# Patient Record
Sex: Male | Born: 1955 | Race: White | Hispanic: No | Marital: Married | Smoking: Former smoker
Health system: Southern US, Academic
[De-identification: ages and names within clinical notes are randomized; demographics above are authoritative.]

## PROBLEM LIST (undated history)

## (undated) HISTORY — PX: SPINAL FUSION: SHX223

## (undated) HISTORY — PX: HX HERNIA REPAIR: SHX51

---

## 1993-05-14 ENCOUNTER — Other Ambulatory Visit (HOSPITAL_COMMUNITY): Payer: Self-pay

## 2013-04-23 IMAGING — CR DG CHEST 2V
2 series · 2 of 2 positions shown · non-contrast
Comparison: None.

CLINICAL DATA: Preoperative evaluation, hypertension, history of
smoking

EXAM:
CHEST  2 VIEW

[w chest pa]
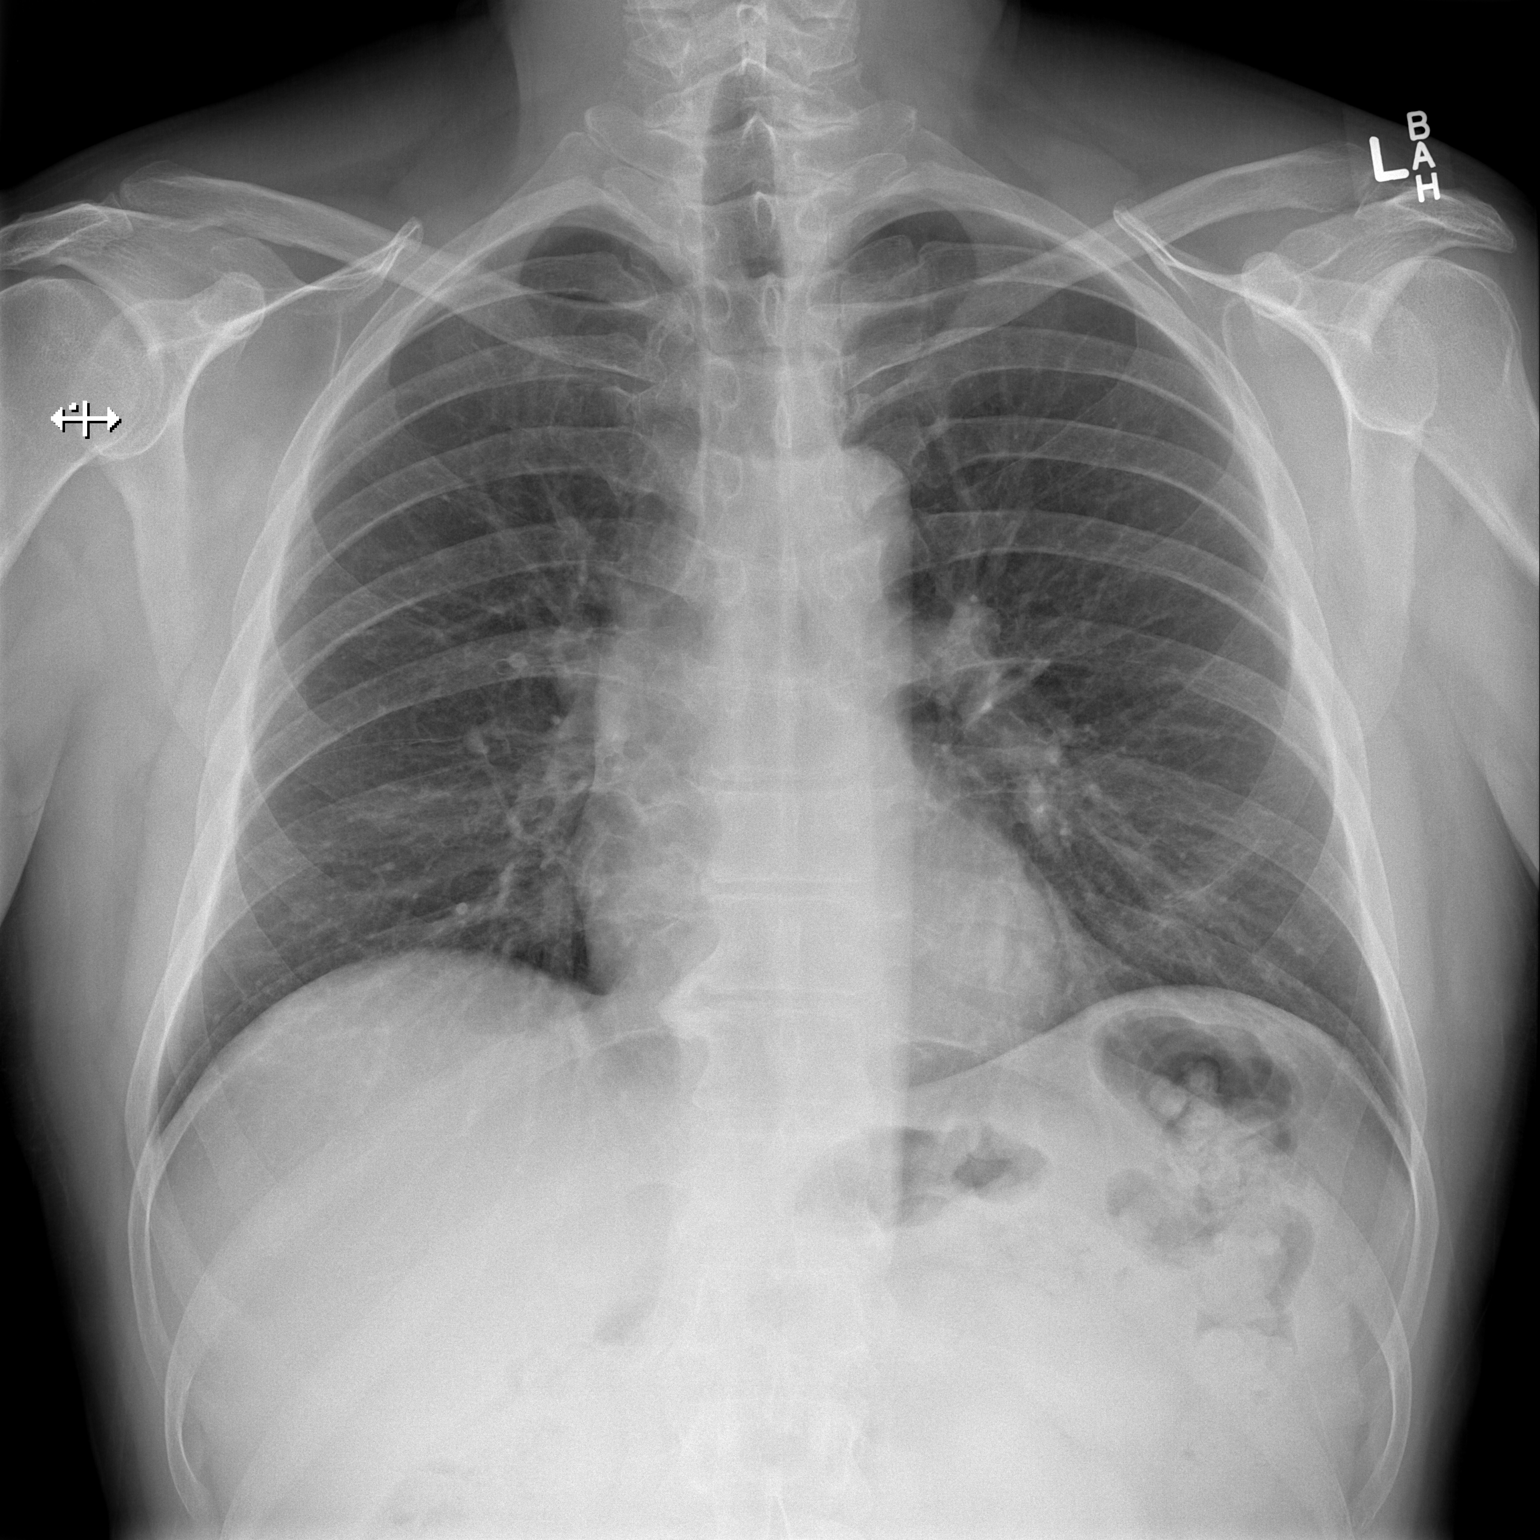

[w chest lat]
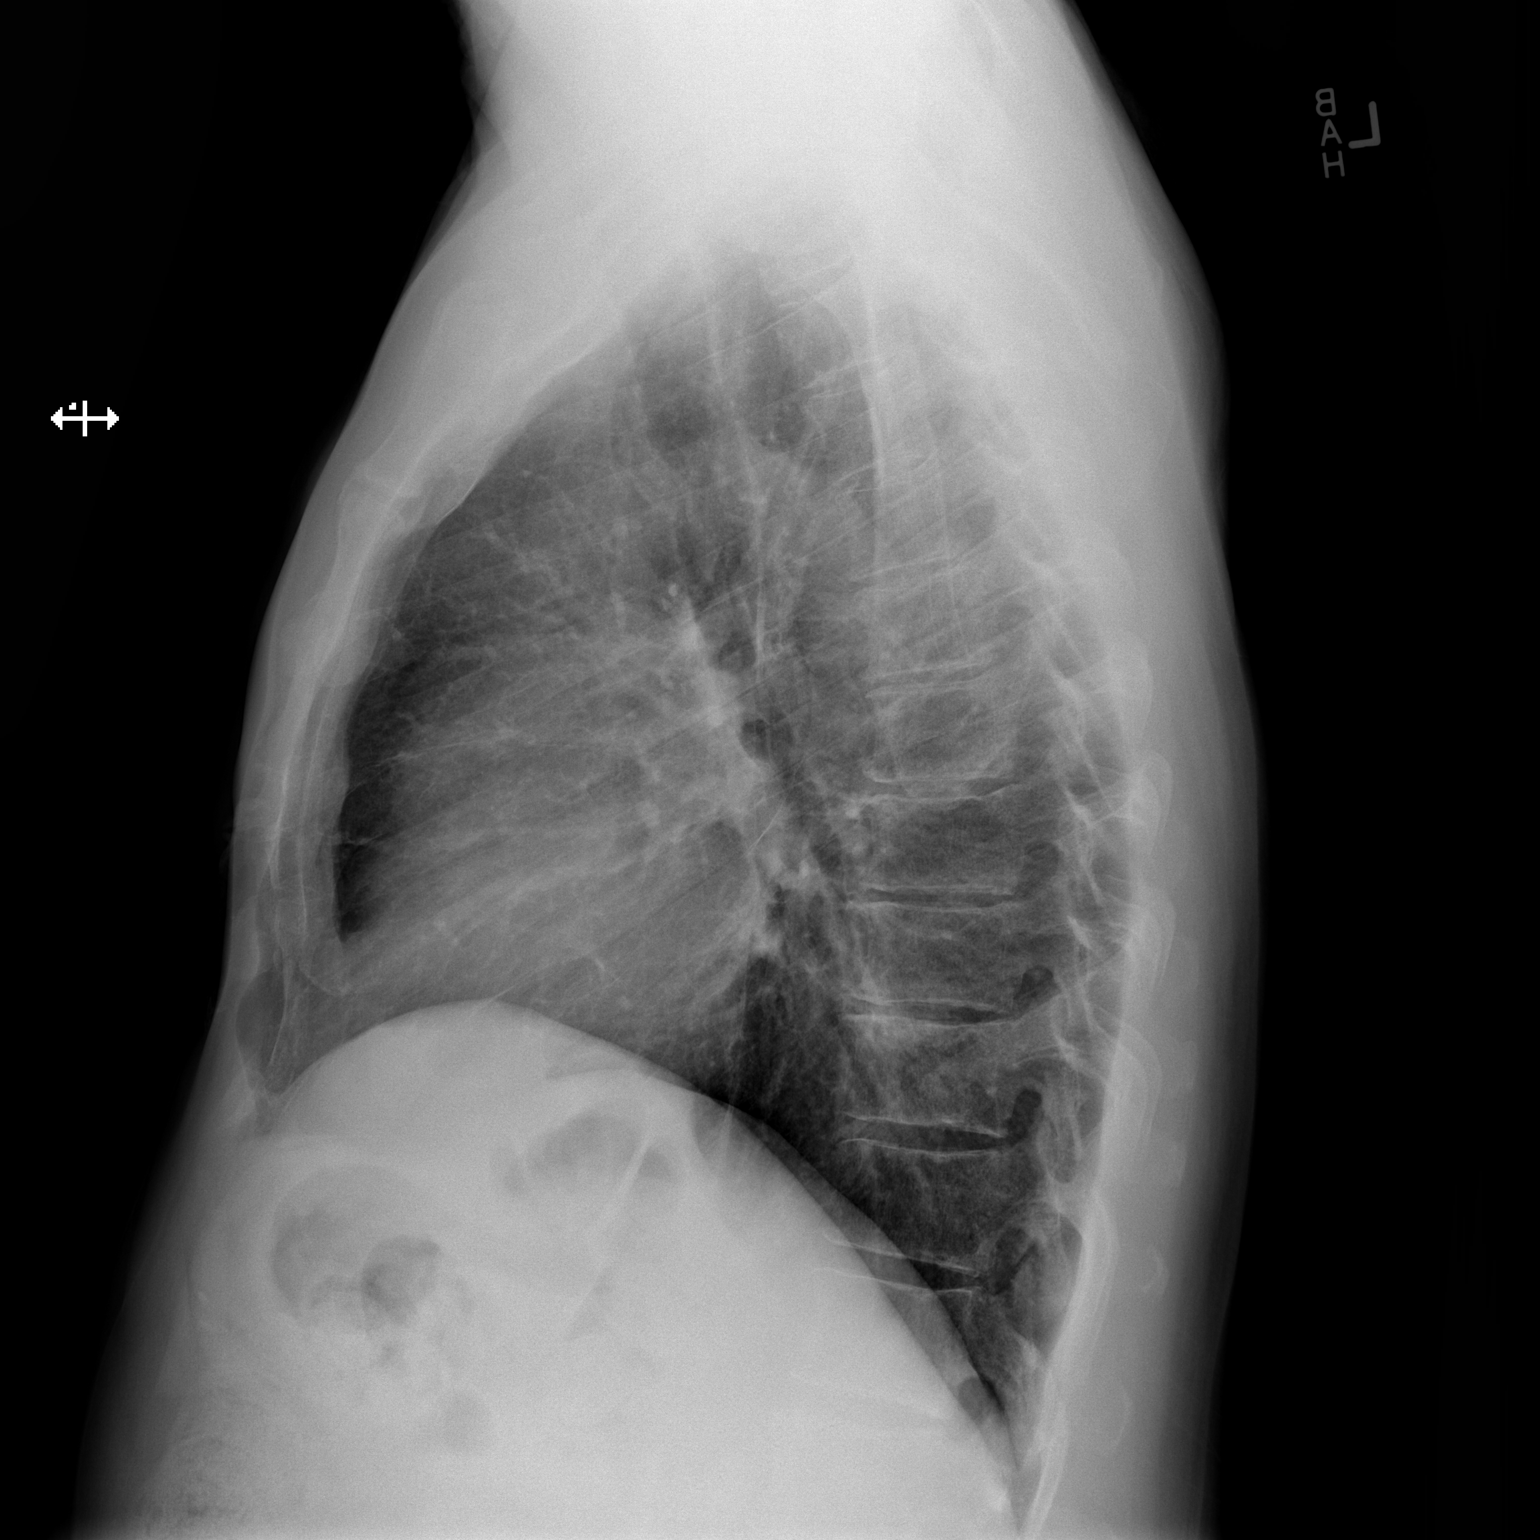

[2 of 2 positions shown; findings below may reference images not displayed]

FINDINGS: The heart size and mediastinal contours are within normal limits.
Both lungs are clear. The visualized skeletal structures are
unremarkable.
IMPRESSION: No active cardiopulmonary disease.

## 2013-05-03 IMAGING — RF DG LUMBAR SPINE 2-3V
1 series · 2 of 2 positions shown · non-contrast
Comparison: Lumbar spine MRI -[DATE]

FLUOROSCOPY TIME:  1 min, 1 second

CLINICAL DATA: PLIF L5-S1

EXAM:
DG C-ARM 1-60 MIN; LUMBAR SPINE - 2-3 VIEW

[Series 1: run · 2 of 2 slices shown]
[im 1/2]
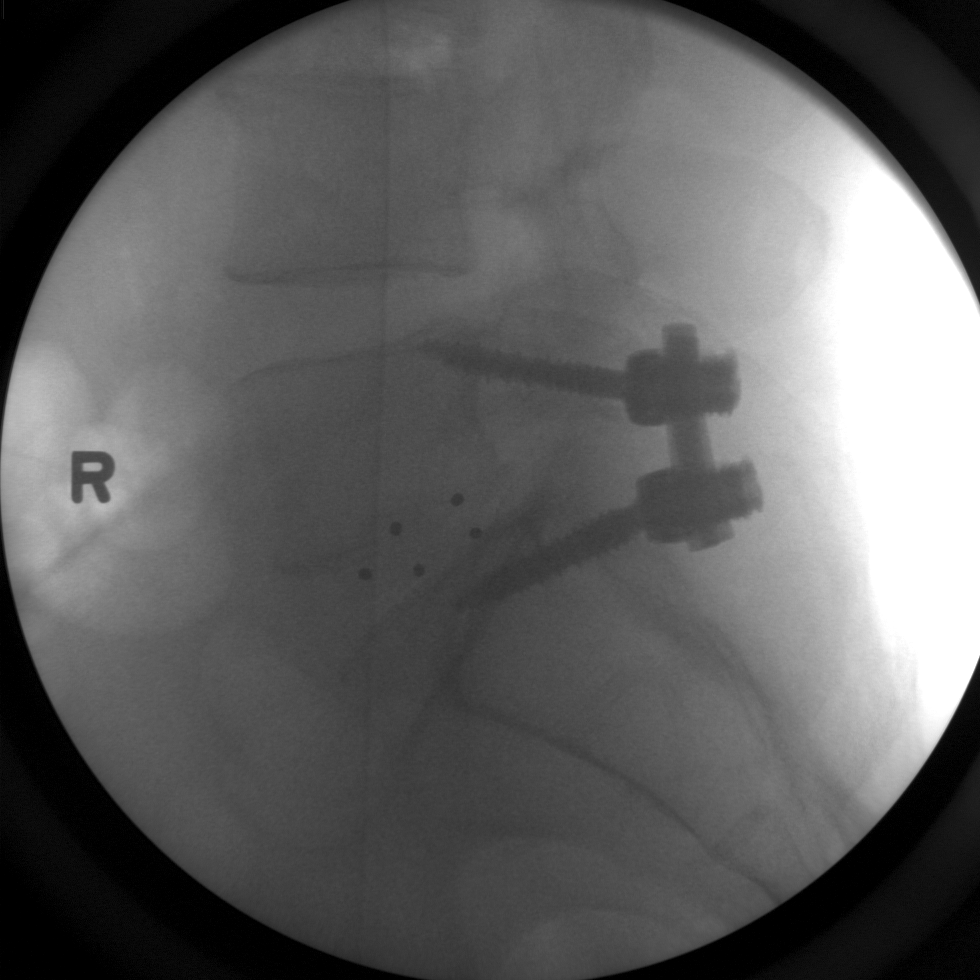
[im 2/2]
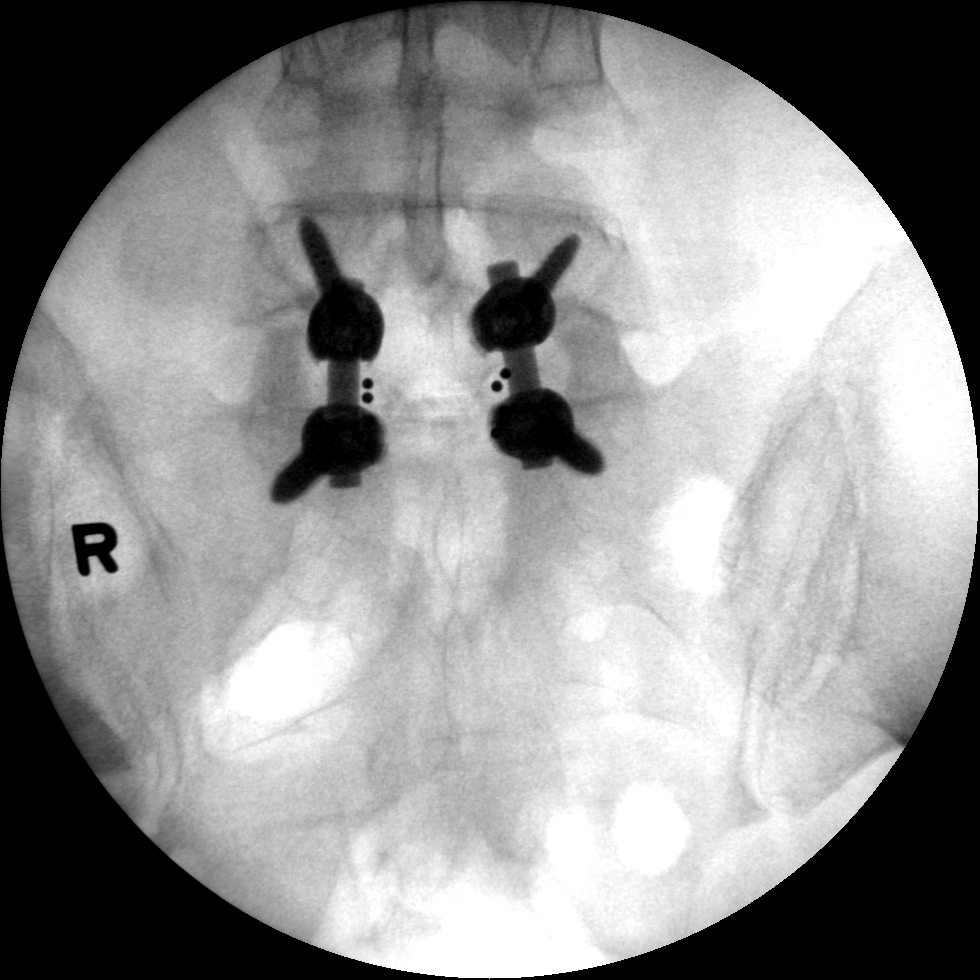

[2 of 2 positions shown; findings below may reference images not displayed]

FINDINGS: Two spot fluoroscopic intraoperative images of the lower lumbar
spine are provided for review.

Lumbar spinal labeling is based on the presumption of 5 non
rib-bearing lumbar type vertebral bodies.

Post L5-S1 of paraspinal fusion and intervertebral disc space
replacement. There has been restoration of the L5-S1 intervertebral
disc space. No definite evidence for hardware failure or loosening.

No definite radiopaque foreign body.
IMPRESSION: Post L5-S1 paraspinal fusion and intervertebral disc space
replacement without definite evidence of complication.

## 2014-04-04 IMAGING — CT CT L SPINE W/O CM
3 of 10 series · 9 of 27 positions shown, 10 images · non-contrast
Comparison: Radiographs [DATE]

CLINICAL DATA: Persistent low back pain for the past few months.
History of L5-S1 fusion and [DATE]. No radicular symptoms.

EXAM:
CT LUMBAR SPINE WITHOUT CONTRAST
TECHNIQUE: Multidetector CT imaging of the lumbar spine was performed without
intravenous contrast administration. Multiplanar CT image
reconstructions were also generated.

[Series 2: l spine bone · axial · 0.32mm/px · z∈[-27,+71]mm · 2 of 118 slices shown, 3 images]
[im 40/118  soft-tissue]
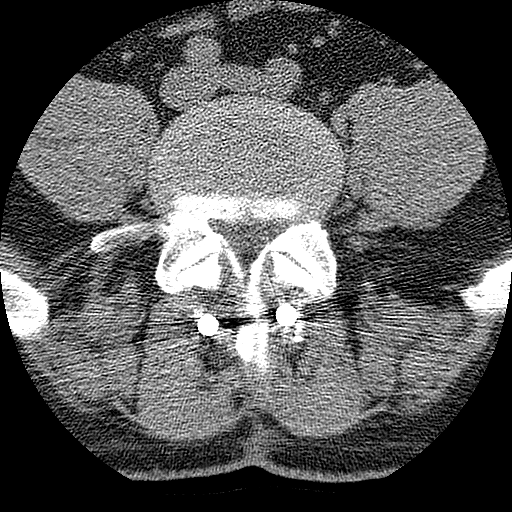
[im 40/118  bone]
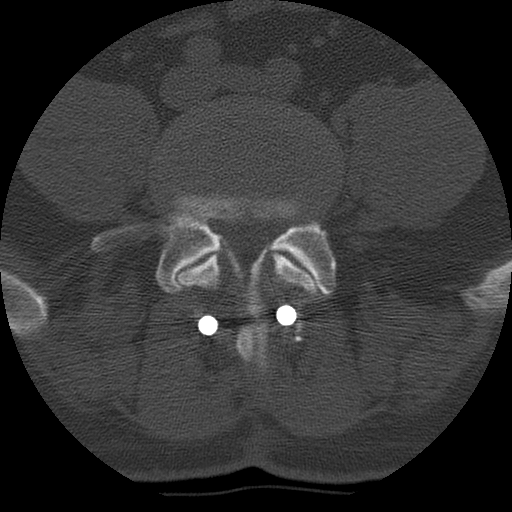
[im 79/118  bone]
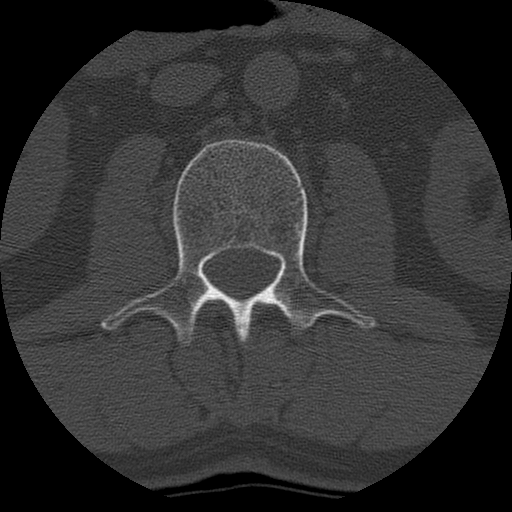

[Series 3: l spine soft · axial · 0.32mm/px · z∈[-27,+71]mm · 2 of 118 slices shown]
[im 40/118  soft-tissue]
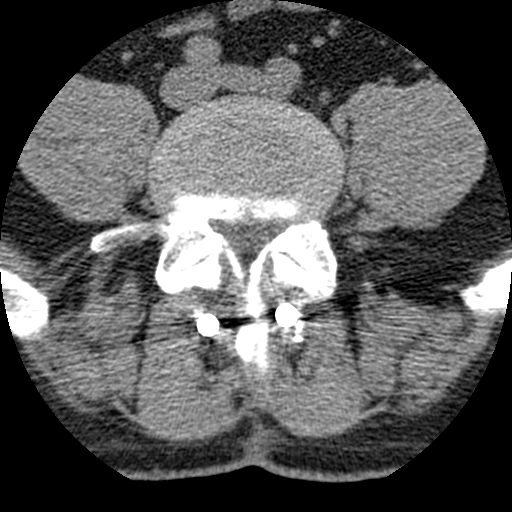
[im 79/118  soft-tissue]
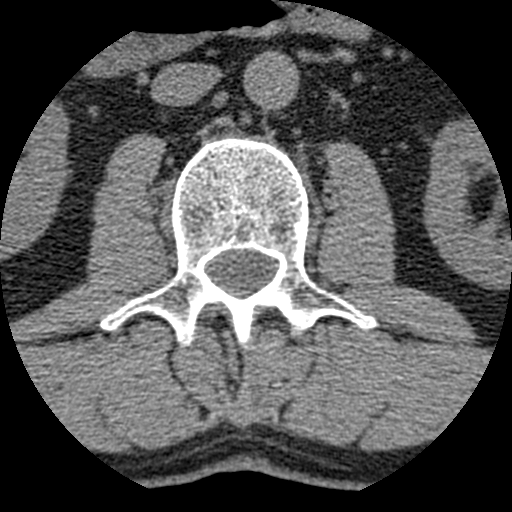

[Series 400: cor · coronal · 0.59mm/px · 5 of 73 slices shown]
[im 13/73  bone]
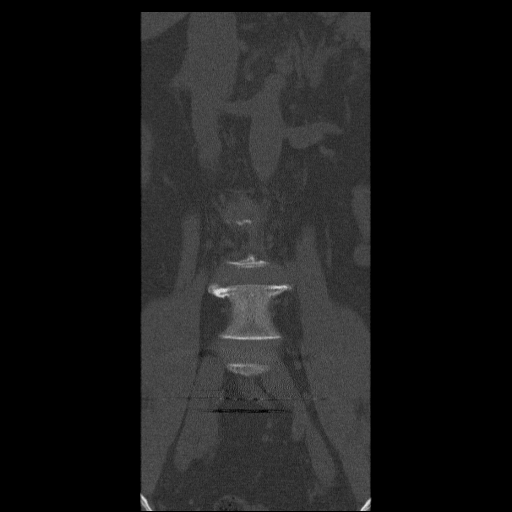
[im 25/73  bone]
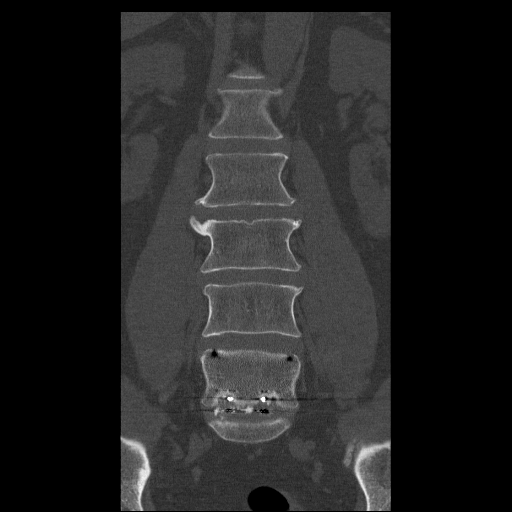
[im 37/73  bone]
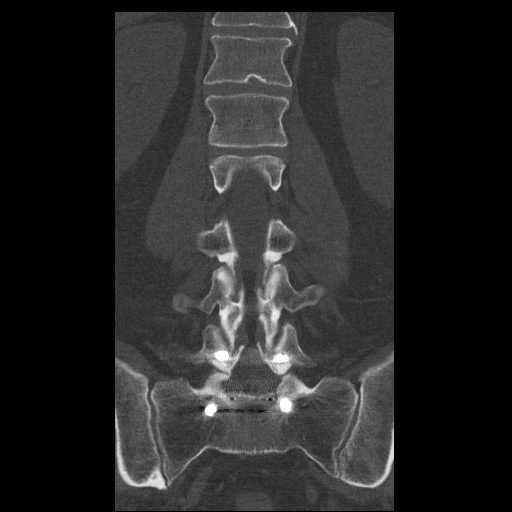
[im 49/73  bone]
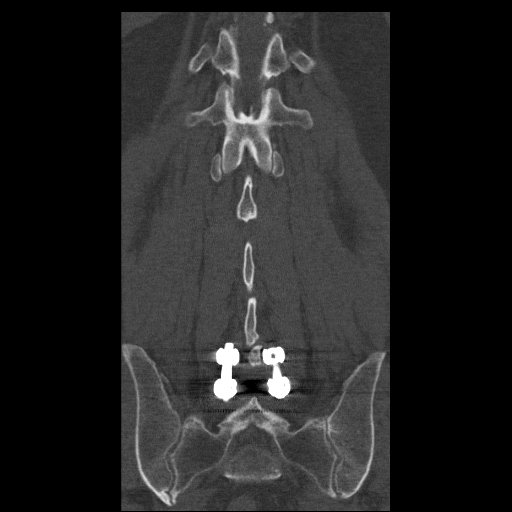
[im 61/73  bone]
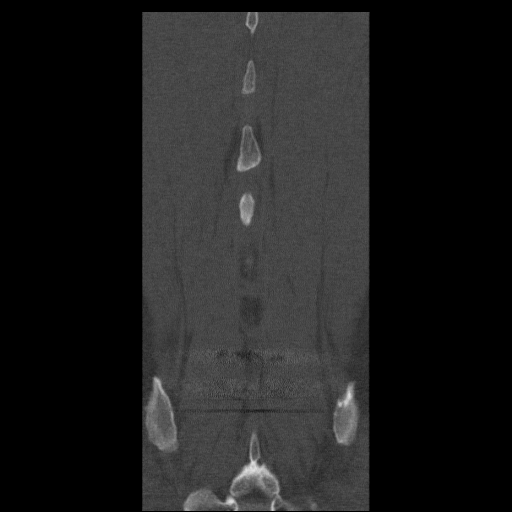

[9 of 27 positions shown; findings below may reference images not displayed]

FINDINGS: Normal alignment of the lumbar vertebral bodies. There are pedicle
screws, posterior rods and an interbody fusion devices at L5-S1. No
complicating features associated with the hardware. No solid
interbody fusion changes are demonstrated. The lumbar vertebral
bodies are maintained. The disc spaces are maintained.

L1-2:  No significant findings.

L2-3: Diffuse annular bulge with mild flattening of the ventral
thecal sac and mild bilateral lateral recess stenosis. There is also
bilateral foraminal encroachment.

L3-4: Mild diffuse annular bulge but no significant spinal stenosis.
Mild lateral recess encroachment bilaterally. No foraminal stenosis.

L4-5: Diffuse bulging annulus, facet disease and ligamentum flavum
thickening contributing to mild to moderate spinal and bilateral
lateral recess stenosis. No significant foraminal stenosis.

L5-S1: Postoperative changes. No complicating features. No solid
interbody fusion changes are demonstrated. The SI joints appear
normal.
IMPRESSION: 1. Diffuse bulging discs at L2-3, L3-4 and L4-5.
2. Postoperative changes at L5-S1. No solid interbody fusion changes
are demonstrated.

## 2022-02-03 ENCOUNTER — Encounter (RURAL_HEALTH_CENTER): Payer: Self-pay | Admitting: Family Medicine

## 2022-02-03 ENCOUNTER — Other Ambulatory Visit: Payer: Self-pay

## 2022-02-03 ENCOUNTER — Ambulatory Visit (RURAL_HEALTH_CENTER): Payer: Medicare Other | Attending: Family Medicine | Admitting: Family Medicine

## 2022-02-03 VITALS — BP 150/91 | HR 92 | Temp 97.9°F | Resp 20 | Ht 70.0 in | Wt 202.0 lb

## 2022-02-03 DIAGNOSIS — Z125 Encounter for screening for malignant neoplasm of prostate: Secondary | ICD-10-CM

## 2022-02-03 DIAGNOSIS — F411 Generalized anxiety disorder: Secondary | ICD-10-CM | POA: Insufficient documentation

## 2022-02-03 DIAGNOSIS — Z794 Long term (current) use of insulin: Secondary | ICD-10-CM | POA: Insufficient documentation

## 2022-02-03 DIAGNOSIS — E119 Type 2 diabetes mellitus without complications: Secondary | ICD-10-CM | POA: Insufficient documentation

## 2022-02-03 DIAGNOSIS — M5126 Other intervertebral disc displacement, lumbar region: Secondary | ICD-10-CM | POA: Insufficient documentation

## 2022-02-03 DIAGNOSIS — I10 Essential (primary) hypertension: Secondary | ICD-10-CM | POA: Insufficient documentation

## 2022-02-03 MED ORDER — TRAMADOL 50 MG TABLET
1.0000 | ORAL_TABLET | Freq: Three times a day (TID) | ORAL | 2 refills | Status: DC | PRN
Start: 2022-02-03 — End: 2022-05-10

## 2022-02-03 MED ORDER — ALPRAZOLAM 0.5 MG TABLET
0.5000 mg | ORAL_TABLET | Freq: Two times a day (BID) | ORAL | 2 refills | Status: DC | PRN
Start: 2022-02-03 — End: 2022-05-10

## 2022-02-03 MED ORDER — DIPHTH,PERTUS(AC)TETANUS(PF)2 LF-(2.5-5-3-5MCG)-5 LF/0.5 ML IM SYRINGE
0.5000 mL | INJECTION | Freq: Once | INTRAMUSCULAR | 0 refills | Status: AC
Start: 2022-02-03 — End: 2022-02-03

## 2022-02-03 NOTE — Nursing Note (Signed)
02/03/22 1437   Depression Screen   Little interest or pleasure in doing things. 0   Feeling down, depressed, or hopeless 0   PHQ 2 Total 0   Trouble falling or staying asleep, or sleeping too much. 0   Feeling tired or having little energy 0   Poor appetite or overeating 0   Feeling bad about yourself/ that you are a failure in the past 2 weeks? 0   Trouble concentrating on things in the past 2 weeks? 0   Moving/Speaking slowly or being fidgety or restless  in the past 2 weeks? 0   Thoughts that you would be better off DEAD, or of hurting yourself in some way. 0   If you checked off any problems, how difficult have these problems made it for you to do your work, take care of things at home, or get along with other people? Not difficult at all   PHQ 9 Total 0

## 2022-02-03 NOTE — Progress Notes (Signed)
FAMILY MEDICINE, [facility]  [address] [place]  [place] [address]  Operated by [facility]     Name: Shane Hines MRN:  [id number]   Date: 02/03/2022 Age: 66 y.o.          Provider: Arnell Sieving, DO    Reason for visit: New Patient (Establish care)      History of Present Illness:  Shane Hines is a 66 y.o. male here today to establish care. Previously following with Dr. Tresea Mall    Diabetes:   ~2012  States they have been high lately because of frequent eating out in [place]. Last was 212. Runs 170's at home. Denies hyper or hypoglycemia.   Metformin and Glimepiride    Hypertension:   No home monitoring reported. No other BP medications ever tried  Lisinopril 10 mg    Back Surgery 2014  Spinal fusion that didn't heal completely with 3 currently herniated disks  Continues to follow with Dr. Yetta Barre in [place], [place]  Tramadol helps some. Flexeril at least every am - sometimes up to 3 times/day. He denies drowsiness or fatigue with this medication. Has been out of Tramadol x 3 months.     Anxiety:   Stable. Reports that he feels the medication works well.   Lexapro 20 mg  Xanax 0.5 twice daily prn      Historical Data    Past Medical History:  Past Medical History:   Diagnosis Date    Anxiety     Diabetes mellitus, type 2 (CMS HCC)     Hypertension     Spinal stenosis     Spinal stenosis        Past Surgical History:  Past Surgical History:   Procedure Laterality Date    HX HERNIA REPAIR Bilateral     SPINAL FUSION      Dr. Marikay [facility]          Allergies:  Allergies   Allergen Reactions    Penicillins      Unknown - reaction as a child     Medications:  Current Outpatient Medications   Medication Sig    ALPRAZolam (XANAX) 0.5 mg Oral Tablet Take 1 Tablet (0.5 mg total) by mouth Twice per day as needed    cyclobenzaprine (FLEXERIL) 10 mg Oral Tablet Take 1 Tablet (10 mg total) by mouth Three times a day    escitalopram oxalate (LEXAPRO) 20 mg  Oral Tablet Take 1 Tablet (20 mg total) by mouth Once a day    glimepiride (AMARYL) 2 mg Oral Tablet Take 1 Tablet (2 mg total) by mouth Every morning    lisinopriL (PRINIVIL) 10 mg Oral Tablet Take 1 Tablet (10 mg total) by mouth Once a day    metFORMIN (GLUCOPHAGE XR) 500 mg Oral Tablet Sustained Release 24 hr Take 2 Tablets (1,000 mg total) by mouth Twice daily    traMADoL (ULTRAM) 50 mg Oral Tablet Take 1 Tablet (50 mg total) by mouth Three times a day as needed     Family History:  Family Medical History:    None         Social History:  Social History     Socioeconomic History    Marital status: Married   Tobacco Use    Smoking status: Never    Smokeless tobacco: Never   Substance and Sexual Activity    Alcohol use: Yes     Alcohol/week: 5.0 standard drinks  Types: 2 Glasses of wine, 3 Cans of beer per week     Comment: occassionally    Drug use: Never           Review of Systems:  Review of Systems   Constitutional:  Negative for fatigue, fever and unexpected weight change.   Respiratory:  Negative for chest tightness and shortness of breath.    Cardiovascular:  Negative for chest pain and leg swelling.   Gastrointestinal:  Negative for constipation, diarrhea, nausea and vomiting.   Endocrine: Negative for polydipsia, polyphagia and polyuria.   Musculoskeletal:  Positive for arthralgias and back pain.   Neurological:  Negative for dizziness, light-headedness and headaches.   Psychiatric/Behavioral:  Negative for dysphoric mood. The patient is nervous/anxious.        Physical Exam:  Vital Signs:  Vitals:    02/03/22 1430   BP: (!) 150/91   Pulse: 92   Resp: 20   Temp: 36.6 C (97.9 F)   SpO2: 95%   Weight: 91.6 kg (202 lb)   Height: 1.778 m (5\' 10" )   BMI: 29.04     Physical Exam  Vitals reviewed.   Constitutional:       General: He is not in acute distress.     Appearance: Normal appearance.   HENT:      Mouth/Throat:      Mouth: Mucous membranes are moist.   Eyes:      Pupils: Pupils are equal, round,  and reactive to light.   Neck:      Vascular: No carotid bruit.   Cardiovascular:      Rate and Rhythm: Normal rate and regular rhythm.      Pulses: Normal pulses.      Heart sounds: Normal heart sounds. No murmur heard.  Pulmonary:      Effort: Pulmonary effort is normal.      Breath sounds: Normal breath sounds.   Abdominal:      General: Bowel sounds are normal.      Palpations: Abdomen is soft.      Tenderness: There is no abdominal tenderness. There is no guarding.   Musculoskeletal:         General: No swelling or deformity.      Cervical back: Neck supple. No tenderness.      Right lower leg: No edema.      Left lower leg: No edema.   Lymphadenopathy:      Cervical: No cervical adenopathy.   Skin:     General: Skin is warm and dry.   Neurological:      General: No focal deficit present.      Mental Status: He is alert.   Psychiatric:         Mood and Affect: Mood normal.         Behavior: Behavior normal.         Thought Content: Thought content normal.           Assessment:  1. Type 2 diabetes mellitus without complication, with long-term current use of insulin (CMS HCC)  Continue Metformin and Glimepiride as previously prescribed. No refills due at this time. Labs ordered for monitoring. Monitor blood sugar at home and bring in logs to follow up appointment. Follow up sooner if your blood sugar is consistently >250 or <80 or you develop symptoms of hyper or hypoglycemia. Continue to focus on a diabetic diet of low carbohydrates and low sugar.   - Monitor your feet regularly for  neuropathy and also checking for blisters, cuts, sores or calluses that may need addressed.   - Follow up with your eye doctor at least once a year for your annual eye exam.   - Labs ordered for review.      - CBC/DIFF; Future  - COMPREHENSIVE METABOLIC PNL, FASTING; Future  - HGA1C (HEMOGLOBIN A1C WITH EST AVG GLUCOSE); Future  - LIPID PANEL; Future  - THYROID STIMULATING HORMONE (SENSITIVE TSH); Future  - MICROALBUMIN/CREATININE  RATIO, URINE, RANDOM; Future    2. Hypertension, unspecified type  Continue Lisinopril as previously prescribed. BP elevated in office today - continue to monitor. Recommend home blood pressure monitoring and bring in logs to follow up appointment. I also recommend that you bring in your blood pressure machine to compare to ours approximately once/year for accuracy. Follow up if your blood pressure is persistently elevated or too low or you are experiencing symptoms of either.     Focus on following the DASH diet (low sodium) to help control blood pressure.     The DASH diet is a balanced eating plan that gives choices of what to eat. The diet helps create a heart-healthy eating style for life. There's no need for special foods or drinks. Foods in the diet are at grocery stores and in most restaurants.     When following DASH, it is important to choose foods that are:  - Rich in potassium, calcium, magnesium, fiber and protein.  - Low in saturated fat.  - Low in salt.   - CBC/DIFF; Future  - COMPREHENSIVE METABOLIC PNL, FASTING; Future  - LIPID PANEL; Future  - THYROID STIMULATING HORMONE (SENSITIVE TSH); Future    3. Lumbar disc herniation  Continue to follow with Dr. Ronnald Ramp. I am agreeable to writing Tramadol and Flexeril. Rx sent to pharmacy.     4. Prostate cancer screening    - PSA SCREENING; Future     5. Generalized Anxiety Disorder:   Lexapro and Xanax refilled. Stable on current medications.     7. Need for TDaP:   Rx sent to pharmacy due to [facility]    Return in about 3 months (around 05/05/2022) for In Person Visit - CDM.    Lowella Grip, DO     Portions of this note may be dictated using voice recognition software or a dictation service. Variances in spelling and vocabulary are possible and unintentional. Not all errors are caught/corrected. Please notify the [facility] if any discrepancies are noted or if the meaning of any statement is not clear.

## 2022-02-09 LAB — LIPID PANEL
CHOLESTEROL: 180
HDL-CHOLESTEROL: 36
LDL (CALCULATED): 107
TRIGLYCERIDES: 214
VLDL (CALCULATED): 37

## 2022-02-09 LAB — CBC/DIFF
BASOPHILS: 1
BASOS ABS: 0.1
EOS ABS: 0.4
EOSINOPHIL: 6
HCT: 43
HGB: 14.7
LYMPHOCYTES: 35
LYMPHS ABS: 2.2
MCH: 31.1
MCHC: 34.2
MCV: 91
MONOCYTES: 10
MONOS ABS: 0.7
PLATELET COUNT: 236
RBC: 4.73
RDW: 12.3
WBC: 6.5

## 2022-02-09 LAB — COMPREHENSIVE METABOLIC PNL, FASTING
ALBUMIN: 4.6
ALKALINE PHOSPHATASE: 56
ALT (SGPT): 38
AST (SGOT): 30
BILIRUBIN, TOTAL: 0.5
BUN/CREAT RATIO: 18
BUN: 19
CALCIUM: 9.9
CARBON DIOXIDE: 23
CHLORIDE: 101
CREATININE: 1.05
ESTIMATED GLOMERULAR FILTRATION RATE: 79
GLUCOSE, FASTING: 241
POTASSIUM: 4.8
SODIUM: 138
TOTAL PROTEIN: 7

## 2022-02-09 LAB — THYROID STIMULATING HORMONE (SENSITIVE TSH): TSH: 0.696

## 2022-02-09 LAB — HGA1C (HEMOGLOBIN A1C WITH EST AVG GLUCOSE): HEMOGLOBIN A1C: 9.3

## 2022-02-09 LAB — MICROALBUMIN/CREATININE RATIO, URINE, RANDOM
CREATININE, UR RAND: 151.8
MICROALBUMIN RANDOM URINE: 3
MICROALBUMIN/CREAT RATIO: 3.9

## 2022-02-09 LAB — PSA SCREENING: PROSTATE SPECIFIC AG: 0.8

## 2022-02-12 ENCOUNTER — Other Ambulatory Visit: Payer: Self-pay

## 2022-02-21 ENCOUNTER — Encounter (RURAL_HEALTH_CENTER): Payer: Self-pay | Admitting: Family Medicine

## 2022-02-28 ENCOUNTER — Other Ambulatory Visit (RURAL_HEALTH_CENTER): Payer: Self-pay | Admitting: Family Medicine

## 2022-02-28 ENCOUNTER — Encounter (RURAL_HEALTH_CENTER): Payer: Self-pay | Admitting: Family Medicine

## 2022-02-28 DIAGNOSIS — M5126 Other intervertebral disc displacement, lumbar region: Secondary | ICD-10-CM

## 2022-02-28 MED ORDER — CYCLOBENZAPRINE 10 MG TABLET
10.0000 mg | ORAL_TABLET | Freq: Three times a day (TID) | ORAL | 1 refills | Status: DC
Start: 2022-02-28 — End: 2022-08-10

## 2022-03-18 ENCOUNTER — Telehealth (RURAL_HEALTH_CENTER): Payer: Self-pay | Admitting: Family Medicine

## 2022-03-18 NOTE — Telephone Encounter (Signed)
Received patient's [facility] labs (copies) and reviewed: Please apologize for the delay.    Blood count looks excellent Sodium, potassium, and kidney function are all excellent. Liver function is normal.     PSA is normal. Thyroid is normal.     Total cholesterol is normal, Triglycerides are elevated at 214 and LDL (bad cholesterol) is elevated.     A1C is significantly elevated at 9.3 - this could be secondary to the frequent eating out prior to appointment; but I would recommend but increasing Amaryl to 2mg  po twice daily with close blood sugar monitoring at home. If patient is agreeable - I can send in new Rx since he will run out sooner. If he does not want to increase then I would recommend close diet monitoring and we can repeat at his appointment in December and make decisions from there.

## 2022-03-21 ENCOUNTER — Other Ambulatory Visit (RURAL_HEALTH_CENTER): Payer: Self-pay

## 2022-03-21 ENCOUNTER — Other Ambulatory Visit (RURAL_HEALTH_CENTER): Payer: Self-pay | Admitting: Family Medicine

## 2022-03-21 DIAGNOSIS — Z794 Long term (current) use of insulin: Secondary | ICD-10-CM

## 2022-03-21 DIAGNOSIS — Z125 Encounter for screening for malignant neoplasm of prostate: Secondary | ICD-10-CM

## 2022-03-21 DIAGNOSIS — I10 Essential (primary) hypertension: Secondary | ICD-10-CM

## 2022-03-21 MED ORDER — GLIMEPIRIDE 2 MG TABLET
2.0000 mg | ORAL_TABLET | Freq: Two times a day (BID) | ORAL | 1 refills | Status: DC
Start: 2022-03-21 — End: 2022-08-10

## 2022-03-21 NOTE — Telephone Encounter (Signed)
Ok to increase the Amaryl - Send to [facility]

## 2022-03-21 NOTE — Telephone Encounter (Signed)
Rx sent to [facility]

## 2022-05-09 ENCOUNTER — Encounter (RURAL_HEALTH_CENTER): Payer: Self-pay | Admitting: Family Medicine

## 2022-05-09 DIAGNOSIS — E119 Type 2 diabetes mellitus without complications: Secondary | ICD-10-CM

## 2022-05-09 DIAGNOSIS — I10 Essential (primary) hypertension: Secondary | ICD-10-CM

## 2022-05-09 DIAGNOSIS — F419 Anxiety disorder, unspecified: Secondary | ICD-10-CM

## 2022-05-09 DIAGNOSIS — M4800 Spinal stenosis, site unspecified: Secondary | ICD-10-CM

## 2022-05-09 HISTORY — DX: Anxiety disorder, unspecified: F41.9

## 2022-05-09 HISTORY — DX: Essential (primary) hypertension: I10

## 2022-05-09 HISTORY — DX: Spinal stenosis, site unspecified: M48.00

## 2022-05-09 HISTORY — DX: Type 2 diabetes mellitus without complications (CMS HCC): E11.9

## 2022-05-09 HISTORY — DX: Type 2 diabetes mellitus without complications: E11.9

## 2022-05-10 ENCOUNTER — Ambulatory Visit (RURAL_HEALTH_CENTER): Payer: Medicare Other | Attending: Family Medicine | Admitting: Family Medicine

## 2022-05-10 ENCOUNTER — Encounter (RURAL_HEALTH_CENTER): Payer: Self-pay | Admitting: Family Medicine

## 2022-05-10 ENCOUNTER — Telehealth (RURAL_HEALTH_CENTER): Payer: Self-pay | Admitting: Family Medicine

## 2022-05-10 ENCOUNTER — Other Ambulatory Visit: Payer: Self-pay

## 2022-05-10 VITALS — BP 123/79 | HR 95 | Temp 98.1°F | Resp 20 | Wt 210.0 lb

## 2022-05-10 DIAGNOSIS — E119 Type 2 diabetes mellitus without complications: Secondary | ICD-10-CM | POA: Insufficient documentation

## 2022-05-10 DIAGNOSIS — F411 Generalized anxiety disorder: Secondary | ICD-10-CM | POA: Insufficient documentation

## 2022-05-10 DIAGNOSIS — I10 Essential (primary) hypertension: Secondary | ICD-10-CM | POA: Insufficient documentation

## 2022-05-10 DIAGNOSIS — M5126 Other intervertebral disc displacement, lumbar region: Secondary | ICD-10-CM | POA: Insufficient documentation

## 2022-05-10 DIAGNOSIS — Z87891 Personal history of nicotine dependence: Secondary | ICD-10-CM | POA: Insufficient documentation

## 2022-05-10 DIAGNOSIS — Z794 Long term (current) use of insulin: Secondary | ICD-10-CM | POA: Insufficient documentation

## 2022-05-10 LAB — COMPREHENSIVE METABOLIC PANEL, NON-FASTING
ALBUMIN: 4.8
ALKALINE PHOSPHATASE: 61
ALT (SGPT): 50
AST (SGOT): 33
BILIRUBIN, TOTAL: 0.5
BUN/CREAT RATIO: 13
BUN: 12
CALCIUM: 10.2
CARBON DIOXIDE: 25
CHLORIDE: 98
CREATININE: 0.95
ESTIMATED GLOMERULAR FILTRATION RATE: 89
GLUCOSE,NONFAST: 178
POTASSIUM: 5.2
SODIUM: 138
TOTAL PROTEIN: 7.4

## 2022-05-10 LAB — HGA1C (HEMOGLOBIN A1C WITH EST AVG GLUCOSE): HEMOGLOBIN A1C: 7.7

## 2022-05-10 MED ORDER — RSVPREF3 ANTIGEN-AS01E ADJUVANT(PF) 120 MCG/0.5 ML IM SUSPENSION, KIT
0.5000 mL | INHALATION_SUSPENSION | Freq: Once | INTRAMUSCULAR | 0 refills | Status: AC
Start: 2022-05-10 — End: 2022-05-10

## 2022-05-10 MED ORDER — ALPRAZOLAM 0.5 MG TABLET
0.5000 mg | ORAL_TABLET | Freq: Two times a day (BID) | ORAL | 2 refills | Status: DC | PRN
Start: 2022-05-10 — End: 2022-08-10

## 2022-05-10 MED ORDER — ESCITALOPRAM 20 MG TABLET
20.0000 mg | ORAL_TABLET | Freq: Every day | ORAL | 1 refills | Status: DC
Start: 2022-05-10 — End: 2022-08-10

## 2022-05-10 MED ORDER — TRAMADOL 50 MG TABLET
1.0000 | ORAL_TABLET | Freq: Three times a day (TID) | ORAL | 2 refills | Status: DC | PRN
Start: 2022-05-10 — End: 2022-08-10

## 2022-05-10 MED ORDER — LISINOPRIL 10 MG TABLET
10.0000 mg | ORAL_TABLET | Freq: Every day | ORAL | 1 refills | Status: DC
Start: 2022-05-10 — End: 2022-08-10

## 2022-05-10 NOTE — Progress Notes (Signed)
FAMILY MEDICINE, [facility]  [address] [place]  [place] [address]  Operated by [facility]     Name: Shane Hines MRN:  [id number]   Date: 05/10/2022 Age: 66 y.o.          Provider: Arnell Sieving, DO    Reason for visit: Follow Up 3 Months (refills)      History of Present Illness:  Shane Hines is a 66 y.o. male here today for 3 month follow up and refills.    Diabetes:   ~2012  He states he has been checking it once to twice/day. His sugars have started to improve since increasing the glimepiride dose after last labs. He denies hypoglycemia episodes. He does admit that his diet has once again been off due to the upcoming holidays and spending time with his family in [place].   Metformin and Glimepiride (increased to BID)    Hypertension:   No home monitoring reported.   Lisinopril 10 mg which he is tolerating well.     Back Surgery 2014  Spinal fusion that didn't heal completely with 3 currently herniated disks  Continues to follow with Dr. Yetta Barre in [place], [place]  Tramadol helps some. Flexeril at least every am - sometimes up to 3 times/day. He denies drowsiness or fatigue with either of these medications.    Anxiety:   Stable overall. He feels that the medications are working well. He denies drowsiness or side effects.  Lexapro 20 mg  Xanax 0.5 twice daily prn      Historical Data    Past Medical History:  Past Medical History:   Diagnosis Date    Anxiety 05/09/2022    Diabetes mellitus, type 2 (CMS HCC) 05/09/2022    Hypertension 05/09/2022    Spinal stenosis 05/09/2022    Spinal stenosis 05/09/2022       Past Surgical History:  Past Surgical History:   Procedure Laterality Date    HX HERNIA REPAIR Bilateral     SPINAL FUSION      Dr. Marikay Alar [facility]          Allergies:  Allergies   Allergen Reactions    Penicillins      Unknown - reaction as a child     Medications:  Current Outpatient Medications   Medication Sig    ALPRAZolam  (XANAX) 0.5 mg Oral Tablet Take 1 Tablet (0.5 mg total) by mouth Twice per day as needed    cyclobenzaprine (FLEXERIL) 10 mg Oral Tablet Take 1 Tablet (10 mg total) by mouth Three times a day    escitalopram oxalate (LEXAPRO) 20 mg Oral Tablet Take 1 Tablet (20 mg total) by mouth Once a day    glimepiride (AMARYL) 2 mg Oral Tablet Take 1 Tablet (2 mg total) by mouth Twice a day before meals    lisinopriL (PRINIVIL) 10 mg Oral Tablet Take 1 Tablet (10 mg total) by mouth Once a day    metFORMIN (GLUCOPHAGE XR) 500 mg Oral Tablet Sustained Release 24 hr Take 2 Tablets (1,000 mg total) by mouth Twice daily    traMADoL (ULTRAM) 50 mg Oral Tablet Take 1 Tablet (50 mg total) by mouth Three times a day as needed     Family History:  Family Medical History:       Problem Relation (Age of Onset)    Alcohol abuse Brother    COPD Father    Depression Brother    Heart Disease  Father    Lung Cancer Mother, Sister    PTSD Brother            Social History:  Social History     Socioeconomic History    Marital status: Married   Occupational History    Occupation: DISABLED   Tobacco Use    Smoking status: Former     Types: Cigarettes    Smokeless tobacco: Never   Vaping Use    Vaping Use: Never used   Substance and Sexual Activity    Alcohol use: Yes     Alcohol/week: 5.0 standard drinks of alcohol     Types: 2 Glasses of wine, 3 Cans of beer per week     Comment: occassionally    Drug use: Never           Review of Systems:  Review of Systems   Constitutional:  Negative for fatigue, fever and unexpected weight change.   Respiratory:  Negative for chest tightness and shortness of breath.    Cardiovascular:  Negative for chest pain and leg swelling.   Gastrointestinal:  Negative for constipation, diarrhea, nausea and vomiting.   Endocrine: Negative for polydipsia, polyphagia and polyuria.   Musculoskeletal:  Positive for arthralgias and back pain.   Neurological:  Negative for dizziness, light-headedness and headaches.    Psychiatric/Behavioral:  Negative for dysphoric mood. The patient is nervous/anxious.          Physical Exam:  Vital Signs:  Vitals:    05/10/22 0812   BP: 123/79   Pulse: 95   Resp: 20   Temp: 36.7 C (98.1 F)   SpO2: 96%   Weight: 95.3 kg (210 lb)     Physical Exam  Vitals reviewed.   Constitutional:       General: He is not in acute distress.     Appearance: Normal appearance.   HENT:      Mouth/Throat:      Mouth: Mucous membranes are moist.   Eyes:      Pupils: Pupils are equal, round, and reactive to light.   Neck:      Vascular: No carotid bruit.   Cardiovascular:      Rate and Rhythm: Normal rate and regular rhythm.      Pulses: Normal pulses.      Heart sounds: Normal heart sounds. No murmur heard.  Pulmonary:      Effort: Pulmonary effort is normal.      Breath sounds: Normal breath sounds.   Abdominal:      General: Bowel sounds are normal.      Palpations: Abdomen is soft.      Tenderness: There is no abdominal tenderness. There is no guarding.   Musculoskeletal:         General: No swelling or deformity.      Cervical back: Neck supple. No tenderness.      Right lower leg: No edema.      Left lower leg: No edema.   Lymphadenopathy:      Cervical: No cervical adenopathy.   Skin:     General: Skin is warm and dry.   Neurological:      General: No focal deficit present.      Mental Status: He is alert.   Psychiatric:         Mood and Affect: Mood normal.         Behavior: Behavior normal.         Thought Content: Thought content normal.  A1C: 7.7  A1C Date: 05/10/2022  COMPLETE BLOOD COUNT   Lab Results   Component Value Date    WBC 6.5 02/09/2022    HGB 14.7 02/09/2022    HCT 43.0 02/09/2022    PLTCNT 236 02/09/2022       DIFFERENTIAL  Lab Results   Component Value Date    LYMPHOCYTES 35 02/09/2022    MONOCYTES 10 02/09/2022    EOSINOPHIL 6 02/09/2022    BASOPHILS 1 02/09/2022    LYMPHSABS 2.2 02/09/2022    EOSABS 0.4 02/09/2022    MONOSABS 0.7 02/09/2022    BASOSABS 0.1 02/09/2022      COMPREHENSIVE METABOLIC PANEL FASTING  Lab Results   Component Value Date    SODIUM 138 05/10/2022    POTASSIUM 5.2 05/10/2022    CHLORIDE 98 05/10/2022    CO2 25 05/10/2022    BUN 12 05/10/2022    CREATININE 0.95 05/10/2022    GLUCOSEFAST 241 02/09/2022    CALCIUM 10.2 05/10/2022    ALBUMIN 4.8 05/10/2022    TOTALPROTEIN 7.4 05/10/2022    ALKPHOS 61 05/10/2022    AST 33 05/10/2022    ALT 50 05/10/2022     LIPID PROFILE  Lab Results   Component Value Date    CHOLESTEROL 180 02/09/2022    HDLCHOL 36 02/09/2022    LDLCHOL 107 02/09/2022    TRIG 214 02/09/2022       THYROID STIMULATING HORMONE  Lab Results   Component Value Date    TSH 0.696 02/09/2022           Assessment:  1. GAD (generalized anxiety disorder)  Stable on current Lexapro and Xanax twice daily which was started by his previous physician years ago. I have agreed to continue it at this time. Recommend follow up if you feel that this medication is no longer controlling your symptoms or if you develop homicidal or suicidal ideations. These medications should not be stopped suddenly as there can be withdrawal side effects. If you feel like you need a medication change, follow up and we can titrate the medication together with close monitoring.     - escitalopram oxalate (LEXAPRO) 20 mg Oral Tablet; Take 1 Tablet (20 mg total) by mouth Once a day  Dispense: 90 Tablet; Refill: 1  - ALPRAZolam (XANAX) 0.5 mg Oral Tablet; Take 1 Tablet (0.5 mg total) by mouth Twice per day as needed  Dispense: 60 Tablet; Refill: 2    2. Lumbar disc herniation  Stable on Tramadol and Flexeril which he uses PRN.     - traMADoL (ULTRAM) 50 mg Oral Tablet; Take 1 Tablet (50 mg total) by mouth Three times a day as needed  Dispense: 90 Tablet; Refill: 2    3. Hypertension, unspecified type  Continue Lisinopril as previously prescribed. Recommend home blood pressure monitoring and bring in logs to follow up appointment. I also recommend that you bring in your blood pressure  machine to compare to ours approximately once/year for accuracy. Follow up if your blood pressure is persistently elevated or too low or you are experiencing symptoms of either.   Focus on following the DASH diet (low sodium) to help control blood pressure.     The DASH diet is a balanced eating plan that gives choices of what to eat. The diet helps create a heart-healthy eating style for life. There's no need for special foods or drinks. Foods in the diet are at grocery stores and in most restaurants.  When following DASH, it is important to choose foods that are:  - Rich in potassium, calcium, magnesium, fiber and protein.  - Low in saturated fat.  - Low in salt.    - lisinopriL (PRINIVIL) 10 mg Oral Tablet; Take 1 Tablet (10 mg total) by mouth Once a day  Dispense: 90 Tablet; Refill: 1  - COMPREHENSIVE METABOLIC PANEL, NON-FASTING; Future    4. Type 2 diabetes mellitus without complication, with long-term current use of insulin (CMS HCC)  We will repeat A1C at appropriate time - may still be elevated since glimepiride dose was just increased. We will continue to follow/monitor.     - HGA1C (HEMOGLOBIN A1C WITH EST AVG GLUCOSE); Future    Return in about 3 months (around 08/09/2022) for In Person Visit - CDM.    Arnell Sieving, DO     Portions of this note may be dictated using voice recognition software or a dictation service. Variances in spelling and vocabulary are possible and unintentional. Not all errors are caught/corrected. Please notify the [facility] if any discrepancies are noted or if the meaning of any statement is not clear.

## 2022-05-10 NOTE — Telephone Encounter (Signed)
Patient request RSV Vaccine

## 2022-05-10 NOTE — Nursing Note (Signed)
05/10/22 0817   PHQ 9 (follow up)   Little interest or pleasure in doing things. 0   Feeling down, depressed, or hopeless 0   Trouble falling or staying asleep, or sleeping too much. 0   Feeling tired or having little energy 0   Poor appetite or overeating 0   Feeling bad about yourself/ that you are a failure in the past 2 weeks? 0   Trouble concentrating on things in the past 2 weeks? 0   Moving/Speaking slowly or being fidgety or restless  in the past 2 weeks? 0   Thoughts that you would be better off DEAD, or of hurting yourself in some way. 0   If you checked off any problems, how difficult have these problems made it for you to do your work, take care of things at home, or get along with other people? Not difficult at all   PHQ 9 Total 0

## 2022-05-11 ENCOUNTER — Other Ambulatory Visit (RURAL_HEALTH_CENTER): Payer: Self-pay

## 2022-05-11 DIAGNOSIS — Z794 Long term (current) use of insulin: Secondary | ICD-10-CM

## 2022-05-11 DIAGNOSIS — I10 Essential (primary) hypertension: Secondary | ICD-10-CM

## 2022-05-14 ENCOUNTER — Other Ambulatory Visit: Payer: Self-pay

## 2022-06-29 ENCOUNTER — Encounter (RURAL_HEALTH_CENTER): Payer: Self-pay | Admitting: Family Medicine

## 2022-08-10 ENCOUNTER — Other Ambulatory Visit: Payer: Self-pay

## 2022-08-10 ENCOUNTER — Ambulatory Visit (RURAL_HEALTH_CENTER): Payer: Medicare Other | Attending: Family Medicine | Admitting: Family Medicine

## 2022-08-10 ENCOUNTER — Encounter (RURAL_HEALTH_CENTER): Payer: Self-pay | Admitting: Family Medicine

## 2022-08-10 DIAGNOSIS — I10 Essential (primary) hypertension: Secondary | ICD-10-CM | POA: Insufficient documentation

## 2022-08-10 DIAGNOSIS — Z87891 Personal history of nicotine dependence: Secondary | ICD-10-CM | POA: Insufficient documentation

## 2022-08-10 DIAGNOSIS — M5126 Other intervertebral disc displacement, lumbar region: Secondary | ICD-10-CM | POA: Insufficient documentation

## 2022-08-10 DIAGNOSIS — Z794 Long term (current) use of insulin: Secondary | ICD-10-CM | POA: Insufficient documentation

## 2022-08-10 DIAGNOSIS — F411 Generalized anxiety disorder: Secondary | ICD-10-CM | POA: Insufficient documentation

## 2022-08-10 DIAGNOSIS — E119 Type 2 diabetes mellitus without complications: Secondary | ICD-10-CM | POA: Insufficient documentation

## 2022-08-10 MED ORDER — TRAMADOL 50 MG TABLET
1.0000 | ORAL_TABLET | Freq: Three times a day (TID) | ORAL | 2 refills | Status: DC | PRN
Start: 2022-08-10 — End: 2022-11-01

## 2022-08-10 MED ORDER — ALPRAZOLAM 0.5 MG TABLET
0.5000 mg | ORAL_TABLET | Freq: Two times a day (BID) | ORAL | 2 refills | Status: DC | PRN
Start: 2022-08-10 — End: 2022-11-01

## 2022-08-10 MED ORDER — METFORMIN ER 500 MG TABLET,EXTENDED RELEASE 24 HR
1000.0000 mg | ORAL_TABLET | Freq: Two times a day (BID) | ORAL | 1 refills | Status: DC
Start: 2022-08-10 — End: 2022-12-26

## 2022-08-10 MED ORDER — LISINOPRIL 10 MG TABLET
10.0000 mg | ORAL_TABLET | Freq: Every day | ORAL | 1 refills | Status: DC
Start: 2022-08-10 — End: 2022-12-26

## 2022-08-10 MED ORDER — GLIMEPIRIDE 2 MG TABLET
2.0000 mg | ORAL_TABLET | Freq: Two times a day (BID) | ORAL | 1 refills | Status: DC
Start: 2022-08-10 — End: 2022-12-26

## 2022-08-10 MED ORDER — ESCITALOPRAM 20 MG TABLET
20.0000 mg | ORAL_TABLET | Freq: Every day | ORAL | 1 refills | Status: DC
Start: 2022-08-10 — End: 2022-12-26

## 2022-08-10 MED ORDER — CYCLOBENZAPRINE 10 MG TABLET
10.0000 mg | ORAL_TABLET | Freq: Three times a day (TID) | ORAL | 1 refills | Status: DC
Start: 2022-08-10 — End: 2022-12-26

## 2022-08-10 NOTE — Progress Notes (Signed)
FAMILY MEDICINE, [facility]  [address] [place]  [place] [address]  Operated by [facility]     Name: Shane Hines MRN:  [id number]   Date: 08/10/2022 Age: 67 y.o.          Provider: Arnell Sieving, DO    Reason for visit: Follow Up 3 Months      History of Present Illness:  Shane Hines is a 67 y.o. male here today for 3 month follow up and refills.    Diabetes:   ~2012  He states he has been checking it once to twice/day. His sugars have started to improve since increasing the glimepiride dose after last labs. He denies hypoglycemia episodes. He does admit that his diet has once again been off due to the upcoming holidays and spending time with his family in [place].   Metformin and Glimepiride (increased to BID)    Hypertension:   No home monitoring reported.   Lisinopril 10 mg which he is tolerating well.     Back Surgery 2014  Spinal fusion that didn't heal completely with 3 currently herniated disks  Continues to follow with Dr. Yetta Barre in [place], [place]  Tramadol helps some. Flexeril at least every am - sometimes up to 3 times/day. He denies drowsiness or fatigue with either of these medications.    Anxiety:   Stable overall. He feels that the medications are working well. He denies drowsiness or side effects.  Lexapro 20 mg  Xanax 0.5 twice daily prn      Historical Data    Past Medical History:  Past Medical History:   Diagnosis Date    Anxiety 05/09/2022    Diabetes mellitus, type 2 (CMS HCC) 05/09/2022    Hypertension 05/09/2022    Spinal stenosis 05/09/2022    Spinal stenosis 05/09/2022       Past Surgical History:  Past Surgical History:   Procedure Laterality Date    HX HERNIA REPAIR Bilateral     SPINAL FUSION      Dr. Marikay Al[facility]          Allergies:  Allergies   Allergen Reactions    Penicillins      Unknown - reaction as a child     Medications:  Current Outpatient Medications   Medication Sig    ALPRAZolam (XANAX) 0.5 mg  Oral Tablet Take 1 Tablet (0.5 mg total) by mouth Twice per day as needed    cyclobenzaprine (FLEXERIL) 10 mg Oral Tablet Take 1 Tablet (10 mg total) by mouth Three times a day    escitalopram oxalate (LEXAPRO) 20 mg Oral Tablet Take 1 Tablet (20 mg total) by mouth Once a day    glimepiride (AMARYL) 2 mg Oral Tablet Take 1 Tablet (2 mg total) by mouth Twice a day before meals    lisinopriL (PRINIVIL) 10 mg Oral Tablet Take 1 Tablet (10 mg total) by mouth Once a day    metFORMIN (GLUCOPHAGE XR) 500 mg Oral Tablet Sustained Release 24 hr Take 2 Tablets (1,000 mg total) by mouth Twice daily    traMADoL (ULTRAM) 50 mg Oral Tablet Take 1 Tablet (50 mg total) by mouth Three times a day as needed     Family History:  Family Medical History:       Problem Relation (Age of Onset)    Alcohol abuse Brother    COPD Father    Depression Brother    Heart Disease Father  Lung Cancer Mother, Sister    PTSD Brother            Social History:  Social History     Socioeconomic History    Marital status: Married   Occupational History    Occupation: DISABLED   Tobacco Use    Smoking status: Former     Types: Cigarettes    Smokeless tobacco: Never   Vaping Use    Vaping status: Never Used   Substance and Sexual Activity    Alcohol use: Yes     Alcohol/week: 5.0 standard drinks of alcohol     Types: 2 Glasses of wine, 3 Cans of beer per week     Comment: occassionally    Drug use: Never           Review of Systems:  Review of Systems   Constitutional:  Negative for fatigue, fever and unexpected weight change.   Respiratory:  Negative for chest tightness and shortness of breath.    Cardiovascular:  Negative for chest pain and leg swelling.   Gastrointestinal:  Negative for constipation, diarrhea, nausea and vomiting.   Endocrine: Negative for polydipsia, polyphagia and polyuria.   Musculoskeletal:  Positive for arthralgias and back pain.   Neurological:  Negative for dizziness, light-headedness and headaches.   Psychiatric/Behavioral:   Negative for dysphoric mood. The patient is not nervous/anxious.          Physical Exam:  Vital Signs:  There were no vitals filed for this visit.    Physical Exam  Vitals reviewed.   Constitutional:       General: He is not in acute distress.     Appearance: Normal appearance.   HENT:      Mouth/Throat:      Mouth: Mucous membranes are moist.   Eyes:      Pupils: Pupils are equal, round, and reactive to light.   Neck:      Vascular: No carotid bruit.   Cardiovascular:      Rate and Rhythm: Normal rate and regular rhythm.      Pulses: Normal pulses.      Heart sounds: Normal heart sounds. No murmur heard.  Pulmonary:      Effort: Pulmonary effort is normal.      Breath sounds: Normal breath sounds.   Abdominal:      General: Bowel sounds are normal.      Palpations: Abdomen is soft.      Tenderness: There is no abdominal tenderness. There is no guarding.   Musculoskeletal:         General: No swelling or deformity.      Cervical back: Neck supple. No tenderness.      Right lower leg: No edema.      Left lower leg: No edema.   Lymphadenopathy:      Cervical: No cervical adenopathy.   Skin:     General: Skin is warm and dry.   Neurological:      General: No focal deficit present.      Mental Status: He is alert.   Psychiatric:         Mood and Affect: Mood normal.         Behavior: Behavior normal.         Thought Content: Thought content normal.     A1C: 7.7  A1C Date: 05/10/2022  COMPLETE BLOOD COUNT   Lab Results   Component Value Date    WBC 6.5 02/09/2022  HGB 14.7 02/09/2022    HCT 43.0 02/09/2022    PLTCNT 236 02/09/2022       DIFFERENTIAL  Lab Results   Component Value Date    LYMPHOCYTES 35 02/09/2022    MONOCYTES 10 02/09/2022    EOSINOPHIL 6 02/09/2022    BASOPHILS 1 02/09/2022    LYMPHSABS 2.2 02/09/2022    EOSABS 0.4 02/09/2022    MONOSABS 0.7 02/09/2022    BASOSABS 0.1 02/09/2022     COMPREHENSIVE METABOLIC PANEL FASTING  Lab Results   Component Value Date    SODIUM 138 05/10/2022    POTASSIUM 5.2  05/10/2022    CHLORIDE 98 05/10/2022    CO2 25 05/10/2022    BUN 12 05/10/2022    CREATININE 0.95 05/10/2022    GLUCOSEFAST 241 02/09/2022    CALCIUM 10.2 05/10/2022    ALBUMIN 4.8 05/10/2022    TOTALPROTEIN 7.4 05/10/2022    ALKPHOS 61 05/10/2022    AST 33 05/10/2022    ALT 50 05/10/2022     LIPID PROFILE  Lab Results   Component Value Date    CHOLESTEROL 180 02/09/2022    HDLCHOL 36 02/09/2022    LDLCHOL 107 02/09/2022    TRIG 214 02/09/2022       THYROID STIMULATING HORMONE  Lab Results   Component Value Date    TSH 0.696 02/09/2022           Assessment:    ICD-10-CM    1. GAD (generalized anxiety disorder)  F41.1 Medications refilled. BOP reviewed and accurate.     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 Symptoms stable on current medications.    cyclobenzaprine (FLEXERIL) 10 mg Oral Tablet     traMADoL (ULTRAM) 50 mg Oral Tablet      3. Hypertension, unspecified type  I10 Chronic, stable. Continue current Lisinopril dose. Labs ordered.    lisinopriL (PRINIVIL) 10 mg Oral Tablet     CBC/DIFF     COMPREHENSIVE METABOLIC PANEL, NON-FASTING      4. Type 2 diabetes mellitus without complication, with long-term current use of insulin (CMS HCC)  E11.9 Chronic. Previously uncontrolled but home readings are starting to improve. No hypoglycemia episodes reported. Continue current medications and monitoring of diet.     glimepiride (AMARYL) 2 mg Oral Tablet    Z79.4 metFORMIN (GLUCOPHAGE XR) 500 mg Oral Tablet Sustained Release 24 hr     LIPID PANEL     CBC/DIFF     COMPREHENSIVE METABOLIC PANEL, NON-FASTING     HGA1C (HEMOGLOBIN A1C WITH EST AVG GLUCOSE)          Return in about 4 months (around 12/10/2022) for In Person Visit - CDM.    Arnell Sieving, DO     Portions of this note may be dictated using voice recognition software or a dictation service. Variances in spelling and vocabulary  are possible and unintentional. Not all errors are caught/corrected. Please notify the Thereasa Parkin if any discrepancies are noted or if the meaning of any statement is not clear.

## 2022-08-31 LAB — CBC/DIFF
BASOPHILS: 1
BASOS ABS: 0.1 (ref 0.0–0.2)
EOS ABS: 0.3 (ref 0.0–0.4)
EOSINOPHIL: 4
HCT: 44.3 (ref 37.5–51.0)
HGB: 15 (ref 13.0–17.7)
LYMPHOCYTES: 45
LYMPHS ABS: 3 (ref 0.7–3.1)
MCH: 30.3 (ref 26.6–33.0)
MCHC: 33.9 (ref 31.5–35.7)
MCV: 90 (ref 79–97)
MONOCYTES: 8
MONOS ABS: 0.6 (ref 0.1–0.9)
PLATELET COUNT: 277 (ref 150–450)
RBC: 4.95 (ref 4.14–5.80)
RDW: 12.2 (ref 11.6–15.4)
WBC: 6.8 (ref 3.4–10.8)

## 2022-08-31 LAB — COMPREHENSIVE METABOLIC PANEL, NON-FASTING
ALBUMIN: 4.9 (ref 3.9–4.9)
ALKALINE PHOSPHATASE: 58 (ref 44–121)
ALT (SGPT): 37 (ref 0–44)
AST (SGOT): 30 (ref 0–40)
BILIRUBIN, TOTAL: 0.8 (ref 0.0–1.2)
BUN/CREAT RATIO: 13 (ref 10–24)
BUN: 13 (ref 8–27)
CALCIUM: 11 (ref 8.6–10.2)
CARBON DIOXIDE: 23 (ref 20–29)
CHLORIDE: 99 (ref 96–106)
CREATININE: 1.04 (ref 0.76–1.27)
ESTIMATED GLOMERULAR FILTRATION RATE: 80 (ref >59)
GLUCOSE,NONFAST: 115 (ref 70–99)
POTASSIUM: 4.9 (ref 3.5–5.2)
SODIUM: 139 (ref 134–144)
TOTAL PROTEIN: 7.4 (ref 6.0–8.5)

## 2022-08-31 LAB — HGA1C (HEMOGLOBIN A1C WITH EST AVG GLUCOSE): HEMOGLOBIN A1C: 6.7 (ref 4.8–5.6)

## 2022-08-31 LAB — LIPID PANEL
CHOLESTEROL: 178 (ref 100–199)
HDL-CHOLESTEROL: 36 (ref >39)
LDL (CALCULATED): 110 (ref 0–99)
TRIGLYCERIDES: 182 (ref 0–149)
VLDL (CALCULATED): 32 (ref 5–40)

## 2022-09-01 ENCOUNTER — Encounter (RURAL_HEALTH_CENTER): Payer: Self-pay | Admitting: Family Medicine

## 2022-09-01 ENCOUNTER — Other Ambulatory Visit (RURAL_HEALTH_CENTER): Payer: Self-pay

## 2022-09-01 DIAGNOSIS — I10 Essential (primary) hypertension: Secondary | ICD-10-CM

## 2022-09-01 DIAGNOSIS — Z794 Long term (current) use of insulin: Secondary | ICD-10-CM

## 2022-09-05 ENCOUNTER — Other Ambulatory Visit (RURAL_HEALTH_CENTER): Payer: Self-pay | Admitting: Family Medicine

## 2022-09-05 MED ORDER — SIMVASTATIN 20 MG TABLET
20.0000 mg | ORAL_TABLET | Freq: Every evening | ORAL | 1 refills | Status: DC
Start: 2022-09-05 — End: 2023-03-23

## 2022-11-01 ENCOUNTER — Other Ambulatory Visit (RURAL_HEALTH_CENTER): Payer: Self-pay | Admitting: Family Medicine

## 2022-11-01 DIAGNOSIS — M5126 Other intervertebral disc displacement, lumbar region: Secondary | ICD-10-CM

## 2022-11-01 DIAGNOSIS — F411 Generalized anxiety disorder: Secondary | ICD-10-CM

## 2022-11-02 MED ORDER — ALPRAZOLAM 0.5 MG TABLET
0.5000 mg | ORAL_TABLET | Freq: Two times a day (BID) | ORAL | 2 refills | Status: DC | PRN
Start: 2022-11-02 — End: 2022-12-26

## 2022-11-02 MED ORDER — TRAMADOL 50 MG TABLET
1.0000 | ORAL_TABLET | Freq: Three times a day (TID) | ORAL | 2 refills | Status: DC | PRN
Start: 2022-11-02 — End: 2022-12-26

## 2022-11-12 ENCOUNTER — Other Ambulatory Visit: Payer: Self-pay

## 2022-12-26 ENCOUNTER — Other Ambulatory Visit: Payer: Self-pay

## 2022-12-26 ENCOUNTER — Ambulatory Visit (RURAL_HEALTH_CENTER): Payer: Medicare Other | Attending: Family Medicine | Admitting: Family Medicine

## 2022-12-26 ENCOUNTER — Encounter (RURAL_HEALTH_CENTER): Payer: Self-pay | Admitting: Family Medicine

## 2022-12-26 VITALS — BP 113/81 | HR 82 | Temp 97.5°F | Resp 20 | Wt 203.4 lb

## 2022-12-26 DIAGNOSIS — Z794 Long term (current) use of insulin: Secondary | ICD-10-CM | POA: Insufficient documentation

## 2022-12-26 DIAGNOSIS — M5126 Other intervertebral disc displacement, lumbar region: Secondary | ICD-10-CM

## 2022-12-26 DIAGNOSIS — E782 Mixed hyperlipidemia: Secondary | ICD-10-CM | POA: Insufficient documentation

## 2022-12-26 DIAGNOSIS — Z79899 Other long term (current) drug therapy: Secondary | ICD-10-CM | POA: Insufficient documentation

## 2022-12-26 DIAGNOSIS — Z87891 Personal history of nicotine dependence: Secondary | ICD-10-CM | POA: Insufficient documentation

## 2022-12-26 DIAGNOSIS — E119 Type 2 diabetes mellitus without complications: Secondary | ICD-10-CM

## 2022-12-26 DIAGNOSIS — F411 Generalized anxiety disorder: Secondary | ICD-10-CM | POA: Insufficient documentation

## 2022-12-26 DIAGNOSIS — I10 Essential (primary) hypertension: Secondary | ICD-10-CM | POA: Insufficient documentation

## 2022-12-26 DIAGNOSIS — Z7984 Long term (current) use of oral hypoglycemic drugs: Secondary | ICD-10-CM | POA: Insufficient documentation

## 2022-12-26 DIAGNOSIS — Z981 Arthrodesis status: Secondary | ICD-10-CM | POA: Insufficient documentation

## 2022-12-26 DIAGNOSIS — E1169 Type 2 diabetes mellitus with other specified complication: Secondary | ICD-10-CM | POA: Insufficient documentation

## 2022-12-26 MED ORDER — TRAMADOL 50 MG TABLET
1.0000 | ORAL_TABLET | Freq: Three times a day (TID) | ORAL | 2 refills | Status: DC | PRN
Start: 2022-12-26 — End: 2023-05-02

## 2022-12-26 MED ORDER — ESCITALOPRAM 20 MG TABLET
20.0000 mg | ORAL_TABLET | Freq: Every day | ORAL | 1 refills | Status: DC
Start: 2022-12-26 — End: 2023-05-02

## 2022-12-26 MED ORDER — METFORMIN ER 500 MG TABLET,EXTENDED RELEASE 24 HR
1000.0000 mg | ORAL_TABLET | Freq: Two times a day (BID) | ORAL | 1 refills | Status: DC
Start: 2022-12-26 — End: 2023-04-18

## 2022-12-26 MED ORDER — CYCLOBENZAPRINE 10 MG TABLET
10.0000 mg | ORAL_TABLET | Freq: Three times a day (TID) | ORAL | 1 refills | Status: DC
Start: 2022-12-26 — End: 2023-05-02

## 2022-12-26 MED ORDER — LISINOPRIL 10 MG TABLET
10.0000 mg | ORAL_TABLET | Freq: Every day | ORAL | 1 refills | Status: DC
Start: 2022-12-26 — End: 2023-05-02

## 2022-12-26 MED ORDER — GLIMEPIRIDE 2 MG TABLET
2.0000 mg | ORAL_TABLET | Freq: Two times a day (BID) | ORAL | 1 refills | Status: DC
Start: 2022-12-26 — End: 2023-05-02

## 2022-12-26 MED ORDER — ALPRAZOLAM 0.5 MG TABLET
0.5000 mg | ORAL_TABLET | Freq: Two times a day (BID) | ORAL | 2 refills | Status: DC | PRN
Start: 2022-12-26 — End: 2023-05-02

## 2022-12-26 NOTE — Progress Notes (Signed)
FAMILY MEDICINE, [facility]  [address] [place]  [place] [address]  Operated by [facility]     Name: Shane Hines MRN:  [id number]   Date: 12/26/2022 Age: 67 y.o.          Provider: Arnell Sieving, DO    Reason for visit: Follow Up 4 Months (No concerns)      History of Present Illness:  Shane Hines is a 67 y.o. male presents today for a 4 month follow up appointment. He denies any complaints or concerns. States his medications are doing well.     Diabetes:   ~2012  He states that yesterday am his glucose was 166 and this am it was 140's. He reports that he did have to have a dental extraction this am.   He states he has been checking it once to twice/day. He denies hypoglycemia episodes. He has lost weight since his last appointment.  Metformin and Glimepiride (increased to BID)     Hypertension:   No home monitoring reported.   Lisinopril 10 mg which he is tolerating well.      Back Surgery 2014  Spinal fusion that didn't heal completely with 3 currently herniated disks  Continues to follow with Dr. Yetta Barre in [place], [place]  Tramadol helps some. Flexeril at least every am - sometimes up to 3 times/day. He denies drowsiness or fatigue with either of these medications.     Anxiety:   Stable overall. He feels that the medications are working well. He denies drowsiness or side effects.  Lexapro 20 mg  Xanax 0.5 twice daily prn    Historical Data    Past Medical History:  Past Medical History:   Diagnosis Date    Anxiety 05/09/2022    Diabetes mellitus, type 2 (CMS HCC) 05/09/2022    Hypertension 05/09/2022    Spinal stenosis 05/09/2022    Spinal stenosis 05/09/2022         Allergies:  Allergies   Allergen Reactions    Penicillins      Unknown - reaction as a child     Medications:  Current Outpatient Medications   Medication Sig    ALPRAZolam (XANAX) 0.5 mg Oral Tablet Take 1 Tablet (0.5 mg total) by mouth Twice per day as needed    cyclobenzaprine (FLEXERIL) 10 mg  Oral Tablet Take 1 Tablet (10 mg total) by mouth Three times a day    escitalopram oxalate (LEXAPRO) 20 mg Oral Tablet Take 1 Tablet (20 mg total) by mouth Once a day    glimepiride (AMARYL) 2 mg Oral Tablet Take 1 Tablet (2 mg total) by mouth Twice a day before meals    lisinopriL (PRINIVIL) 10 mg Oral Tablet Take 1 Tablet (10 mg total) by mouth Once a day    metFORMIN (GLUCOPHAGE XR) 500 mg Oral Tablet Sustained Release 24 hr Take 2 Tablets (1,000 mg total) by mouth Twice daily    simvastatin (ZOCOR) 20 mg Oral Tablet Take 1 Tablet (20 mg total) by mouth Every evening    traMADoL (ULTRAM) 50 mg Oral Tablet Take 1 Tablet (50 mg total) by mouth Three times a day as needed     Social History:  Social History     Socioeconomic History    Marital status: Married   Occupational History    Occupation: DISABLED   Tobacco Use    Smoking status: Former     Types: Cigarettes    Smokeless tobacco: Never  Vaping Use    Vaping status: Never Used   Substance and Sexual Activity    Alcohol use: Yes     Alcohol/week: 5.0 standard drinks of alcohol     Types: 2 Glasses of wine, 3 Cans of beer per week     Comment: occassionally    Drug use: Never           Review of Systems:  Other than ROS in the HPI, all other systems were negative.    Physical Exam:  Vital Signs:  Vitals:    12/26/22 0848   BP: 113/81   Pulse: 82   Resp: 20   Temp: 36.4 C (97.5 F)   SpO2: 95%   Weight: 92.3 kg (203 lb 6 oz)     Physical Exam  Vitals reviewed.   Constitutional:       General: He is not in acute distress.     Appearance: Normal appearance.   HENT:      Mouth/Throat:      Mouth: Mucous membranes are moist.   Cardiovascular:      Rate and Rhythm: Normal rate and regular rhythm.      Pulses: Normal pulses.      Heart sounds: Normal heart sounds. No murmur heard.  Pulmonary:      Effort: Pulmonary effort is normal.      Breath sounds: Normal breath sounds.   Abdominal:      General: Bowel sounds are normal.      Palpations: Abdomen is soft.       Tenderness: There is no abdominal tenderness. There is no guarding.   Musculoskeletal:         General: No swelling or deformity.      Cervical back: Neck supple.   Lymphadenopathy:      Cervical: No cervical adenopathy.   Skin:     General: Skin is warm and dry.   Neurological:      General: No focal deficit present.      Mental Status: He is alert.   Psychiatric:         Mood and Affect: Mood normal.         Behavior: Behavior normal.         Previously completed labs, imaging studies and medical records were reviewed as part of this office visit.     Assessment/Plan:      Depression screening is negative. PHQ 2 Total: 0         ICD-10-CM    1. 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 Continue medications as previously prescribed.     [employer] was last reviewed on 12/26/2022  9:28 AM by Sathvika Ojo, Luisa Hart and result was REVIEWED PDMP.     Based on this, I have no concerns for prescribing controlled substances at this time.      cyclobenzaprine (FLEXERIL) 10 mg Oral Tablet     traMADoL (ULTRAM) 50 mg Oral Tablet      2. GAD (generalized anxiety disorder)  F41.1 Chronic, stable. Patient reports he is tolerating current medications well without difficulty.     escitalopram oxalate (LEXAPRO) 20 mg Oral Tablet     ALPRAZolam (XANAX) 0.5 mg Oral Tablet      3. Type 2 diabetes mellitus without complication, with long-term current use of insulin (CMS HCC)  E11.9 Chronic, stable. Continue medications as previously prescribed. Labs ordered for periodic monitoring.     glimepiride (AMARYL) 2 mg Oral Tablet    Z79.4 metFORMIN (  GLUCOPHAGE XR) 500 mg Oral Tablet Sustained Release 24 hr     HGA1C (HEMOGLOBIN A1C WITH EST AVG GLUCOSE)     BASIC METABOLIC PANEL      4. Hypertension, unspecified type  I10 Chronic, stable. Continue medication as previously prescribed.     lisinopriL (PRINIVIL) 10 mg Oral Tablet     BASIC METABOLIC PANEL   5. 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 Continue  to try and restart exercise program as tolerated with back and neck.          Return in about 4 months (around 04/28/2023) for In Person Visit - CDM.    Arnell Sieving, DO     Portions of this note may be dictated using voice recognition software or a dictation service. Variances in spelling and vocabulary are possible and unintentional. Not all errors are caught/corrected. Please notify the Thereasa Parkin if any discrepancies are noted or if the meaning of any statement is not clear.

## 2023-01-26 LAB — HGA1C (HEMOGLOBIN A1C WITH EST AVG GLUCOSE): HEMOGLOBIN A1C: 6.8 (ref 4.8–5.6)

## 2023-01-27 ENCOUNTER — Other Ambulatory Visit (RURAL_HEALTH_CENTER): Payer: Self-pay

## 2023-01-27 DIAGNOSIS — E119 Type 2 diabetes mellitus without complications: Secondary | ICD-10-CM

## 2023-01-27 DIAGNOSIS — I10 Essential (primary) hypertension: Secondary | ICD-10-CM

## 2023-01-27 LAB — BASIC METABOLIC PANEL
BUN/CREAT RATIO: 14 (ref 10–24)
BUN: 14 (ref 8–27)
CALCIUM: 9.7 (ref 8.6–10.2)
CARBON DIOXIDE: 21 (ref 20–29)
CHLORIDE: 103 (ref 96–106)
CREATININE: 1 (ref 0.76–1.27)
ESTIMATED GLOMERULAR FILTRATION RATE: 84 (ref >59)
GLUCOSE,NONFAST: 155 (ref 70–99)
POTASSIUM: 4.6 (ref 3.5–5.2)
SODIUM: 138 (ref 134–144)

## 2023-02-02 ENCOUNTER — Encounter (RURAL_HEALTH_CENTER): Payer: Self-pay

## 2023-03-23 ENCOUNTER — Other Ambulatory Visit (RURAL_HEALTH_CENTER): Payer: Self-pay | Admitting: Family Medicine

## 2023-03-23 DIAGNOSIS — M5126 Other intervertebral disc displacement, lumbar region: Secondary | ICD-10-CM

## 2023-03-23 DIAGNOSIS — E119 Type 2 diabetes mellitus without complications: Secondary | ICD-10-CM

## 2023-03-23 NOTE — Telephone Encounter (Signed)
180 day sent 12/26/22

## 2023-03-24 ENCOUNTER — Encounter (RURAL_HEALTH_CENTER): Payer: Self-pay | Admitting: Family Medicine

## 2023-03-25 ENCOUNTER — Other Ambulatory Visit (RURAL_HEALTH_CENTER): Payer: Self-pay | Admitting: Family Medicine

## 2023-03-25 DIAGNOSIS — E119 Type 2 diabetes mellitus without complications: Secondary | ICD-10-CM

## 2023-04-17 ENCOUNTER — Other Ambulatory Visit (RURAL_HEALTH_CENTER): Payer: Self-pay | Admitting: Family Medicine

## 2023-04-17 DIAGNOSIS — Z794 Long term (current) use of insulin: Secondary | ICD-10-CM

## 2023-04-18 ENCOUNTER — Telehealth (RURAL_HEALTH_CENTER): Payer: Self-pay | Admitting: Family Medicine

## 2023-04-18 DIAGNOSIS — E119 Type 2 diabetes mellitus without complications: Secondary | ICD-10-CM

## 2023-04-18 MED ORDER — METFORMIN ER 500 MG TABLET,EXTENDED RELEASE 24 HR
1000.0000 mg | ORAL_TABLET | Freq: Two times a day (BID) | ORAL | 0 refills | Status: DC
Start: 2023-04-18 — End: 2023-07-19

## 2023-04-18 NOTE — Nursing Note (Signed)
Pharmacy called 180 tabs sent in and only lasts 45 days. Out of meds all month.

## 2023-05-02 ENCOUNTER — Ambulatory Visit (RURAL_HEALTH_CENTER): Payer: Medicare Other | Attending: Family Medicine | Admitting: Family Medicine

## 2023-05-02 ENCOUNTER — Encounter (RURAL_HEALTH_CENTER): Payer: Self-pay | Admitting: Family Medicine

## 2023-05-02 ENCOUNTER — Other Ambulatory Visit: Payer: Self-pay

## 2023-05-02 VITALS — BP 157/86 | HR 95 | Temp 97.9°F | Resp 16 | Wt 205.0 lb

## 2023-05-02 DIAGNOSIS — S060XAA Concussion with loss of consciousness status unknown, initial encounter: Secondary | ICD-10-CM | POA: Insufficient documentation

## 2023-05-02 DIAGNOSIS — Z9889 Other specified postprocedural states: Secondary | ICD-10-CM | POA: Insufficient documentation

## 2023-05-02 DIAGNOSIS — I10 Essential (primary) hypertension: Secondary | ICD-10-CM | POA: Insufficient documentation

## 2023-05-02 DIAGNOSIS — Z7984 Long term (current) use of oral hypoglycemic drugs: Secondary | ICD-10-CM | POA: Insufficient documentation

## 2023-05-02 DIAGNOSIS — Z794 Long term (current) use of insulin: Secondary | ICD-10-CM | POA: Insufficient documentation

## 2023-05-02 DIAGNOSIS — F411 Generalized anxiety disorder: Secondary | ICD-10-CM | POA: Insufficient documentation

## 2023-05-02 DIAGNOSIS — Z79899 Other long term (current) drug therapy: Secondary | ICD-10-CM | POA: Insufficient documentation

## 2023-05-02 DIAGNOSIS — Z981 Arthrodesis status: Secondary | ICD-10-CM | POA: Insufficient documentation

## 2023-05-02 DIAGNOSIS — E119 Type 2 diabetes mellitus without complications: Secondary | ICD-10-CM | POA: Insufficient documentation

## 2023-05-02 DIAGNOSIS — Z87891 Personal history of nicotine dependence: Secondary | ICD-10-CM | POA: Insufficient documentation

## 2023-05-02 DIAGNOSIS — E782 Mixed hyperlipidemia: Secondary | ICD-10-CM | POA: Insufficient documentation

## 2023-05-02 DIAGNOSIS — W11XXXA Fall on and from ladder, initial encounter: Secondary | ICD-10-CM | POA: Insufficient documentation

## 2023-05-02 DIAGNOSIS — E1169 Type 2 diabetes mellitus with other specified complication: Secondary | ICD-10-CM | POA: Insufficient documentation

## 2023-05-02 DIAGNOSIS — K4090 Unilateral inguinal hernia, without obstruction or gangrene, not specified as recurrent: Secondary | ICD-10-CM | POA: Insufficient documentation

## 2023-05-02 DIAGNOSIS — M4800 Spinal stenosis, site unspecified: Secondary | ICD-10-CM | POA: Insufficient documentation

## 2023-05-02 DIAGNOSIS — M5126 Other intervertebral disc displacement, lumbar region: Secondary | ICD-10-CM | POA: Insufficient documentation

## 2023-05-02 DIAGNOSIS — F419 Anxiety disorder, unspecified: Secondary | ICD-10-CM | POA: Insufficient documentation

## 2023-05-02 MED ORDER — SIMVASTATIN 20 MG TABLET
20.0000 mg | ORAL_TABLET | Freq: Every evening | ORAL | 0 refills | Status: DC
Start: 2023-05-02 — End: 2023-10-19

## 2023-05-02 MED ORDER — TRAMADOL 50 MG TABLET
1.0000 | ORAL_TABLET | Freq: Three times a day (TID) | ORAL | 2 refills | Status: DC | PRN
Start: 2023-05-02 — End: 2023-08-16

## 2023-05-02 MED ORDER — LISINOPRIL 10 MG TABLET
10.0000 mg | ORAL_TABLET | Freq: Every day | ORAL | 1 refills | Status: DC
Start: 2023-05-02 — End: 2024-01-08

## 2023-05-02 MED ORDER — ALPRAZOLAM 0.5 MG TABLET
0.5000 mg | ORAL_TABLET | Freq: Two times a day (BID) | ORAL | 2 refills | Status: DC | PRN
Start: 2023-05-02 — End: 2023-08-16

## 2023-05-02 MED ORDER — GLIMEPIRIDE 2 MG TABLET
2.0000 mg | ORAL_TABLET | Freq: Two times a day (BID) | ORAL | 1 refills | Status: DC
Start: 2023-05-02 — End: 2024-01-08

## 2023-05-02 MED ORDER — ESCITALOPRAM 20 MG TABLET
20.0000 mg | ORAL_TABLET | Freq: Every day | ORAL | 1 refills | Status: DC
Start: 2023-05-02 — End: 2024-01-08

## 2023-05-02 MED ORDER — CYCLOBENZAPRINE 10 MG TABLET
10.0000 mg | ORAL_TABLET | Freq: Three times a day (TID) | ORAL | 1 refills | Status: AC
Start: 2023-05-02 — End: ?

## 2023-05-02 NOTE — Progress Notes (Signed)
FAMILY MEDICINE, [facility]  [address] [place]  [place] [address]  Operated by [facility]     Name: Shane Hines MRN:  [id number]   Date: 05/02/2023 Age: 67 y.o.          Provider: Arnell Sieving, DO    Reason for visit: Follow Up 4 Months, Back Pain Larey Seat off a ladder first part of August  and landed flat on his back . Bothering every since ), Hernia (Needs a repair from a previous hernia surgery around 2012 ), and Concussion (Has had multiple concussions and is having some symptoms of long term issue from it . Is seeing lights and people that are not there .)      History of Present Illness:  Shane Hines is a 67 y.o. male presents today for a 4 month follow up appointment.     Back Pain:   Cleaning out snow guards on the edge of the roof - was standing on the top of the ladder and thought he was grabbing a pile of leaves but grabbed a yellow jackets nest instead - fell 6 feet flat onto his back. He did lose consciousness and is unsure how long he was out.   He states that the pain is higher up than his previous spinal fusion. Pain seems to come and go.     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 He admits to forgetfulness and difficulty remembering. He admits to seeing flashes of light sometimes that aren't there and he admits to seeing shadows of people in his peripheral vision.  Eye Exam completed yesterday - normal per patient report.      Diabetes:   ~2012  He states that yesterday am his glucose was 166 and this am it was 140's. He reports that he did have to have a dental extraction this am.   He states he has been checking it once to twice/day. He denies hypoglycemia episodes. He has lost weight since his last appointment.  He was trying to be more mindful of diet while on Thanksgiving holiday.  Metformin and Glimepiride (increased to BID)    Hypertension:   No home monitoring reported. No chest pain or shortness of  breath. No feeling like BP is elevated or bottoming out.   Lisinopril 10 mg which he is tolerating well.      Back Surgery 2014  Spinal fusion that didn't heal completely with 3 currently herniated disks  Continues to follow with Dr. Yetta Barre in [place], [place]  Tramadol helps some. Flexeril at least every am - sometimes up to 3 times/day. He denies drowsiness or fatigue with either of these medications.     Anxiety:   Stable overall. He feels that the medications are working well. He denies drowsiness or side effects.  Lexapro 20 mg  Xanax 0.5 twice daily prn    Right Inguinal Hernia Repair:      Historical Data    Past Medical History:  Past Medical History:   Diagnosis Date    Anxiety 05/09/2022    Diabetes mellitus, type 2 (CMS HCC) 05/09/2022    Hypertension 05/09/2022    Spinal stenosis 05/09/2022    Spinal stenosis 05/09/2022         Allergies:  Allergies   Allergen Reactions    Penicillins      Unknown - reaction as a child     Medications:  Current Outpatient Medications  Medication Sig    ALPRAZolam (XANAX) 0.5 mg Oral Tablet Take 1 Tablet (0.5 mg total) by mouth Twice per day as needed    cyclobenzaprine (FLEXERIL) 10 mg Oral Tablet Take 1 Tablet (10 mg total) by mouth Three times a day    escitalopram oxalate (LEXAPRO) 20 mg Oral Tablet Take 1 Tablet (20 mg total) by mouth Once a day    glimepiride (AMARYL) 2 mg Oral Tablet Take 1 Tablet (2 mg total) by mouth Twice a day before meals    lisinopriL (PRINIVIL) 10 mg Oral Tablet Take 1 Tablet (10 mg total) by mouth Once a day    metFORMIN (GLUCOPHAGE XR) 500 mg Oral Tablet Sustained Release 24 hr Take 2 Tablets (1,000 mg total) by mouth Twice daily    simvastatin (ZOCOR) 20 mg Oral Tablet TAKE ONE TABLET BY MOUTH EVERY EVENING    traMADoL (ULTRAM) 50 mg Oral Tablet Take 1 Tablet (50 mg total) by mouth Three times a day as needed     Social History:  Social History     Socioeconomic History    Marital status: Married   Occupational History    Occupation:  DISABLED   Tobacco Use    Smoking status: Former     Types: Cigarettes    Smokeless tobacco: Never   Vaping Use    Vaping status: Never Used   Substance and Sexual Activity    Alcohol use: Yes     Alcohol/week: 5.0 standard drinks of alcohol     Types: 2 Glasses of wine, 3 Cans of beer per week     Comment: occassionally    Drug use: Never           Review of Systems:  Other than ROS in the HPI, all other systems were negative.    Physical Exam:  Vital Signs:  Vitals:    05/02/23 1410   BP: (!) 157/86   Pulse: 95   Resp: 16   Temp: 36.6 C (97.9 F)   TempSrc: Skin   SpO2: 94%   Weight: 93 kg (205 lb)     Physical Exam  Vitals reviewed.   Constitutional:       General: He is not in acute distress.     Appearance: Normal appearance.   HENT:      Mouth/Throat:      Mouth: Mucous membranes are moist.   Cardiovascular:      Rate and Rhythm: Normal rate.      Heart sounds: No murmur heard.  Pulmonary:      Breath sounds: Normal breath sounds.   Abdominal:      General: Bowel sounds are normal.      Palpations: Abdomen is soft.      Tenderness: There is no abdominal tenderness. There is no guarding.   Musculoskeletal:         General: No swelling or deformity.      Comments: Swelling in right lower thoracic/upper lumbar region with increased muscle spasm and tenderness. No midline tenderness appreciated.   Skin:     General: Skin is warm and dry.   Neurological:      General: No focal deficit present.      Mental Status: He is alert and oriented to person, place, and time.   Psychiatric:         Mood and Affect: Mood normal.         Previously completed labs, imaging studies and medical records were reviewed as part  of this office visit.     Assessment/Plan:        ICD-10-CM    1. Spinal stenosis  M48.00 S/P surgery with delayed healing.       2. GAD (generalized anxiety disorder)  F41.1 Chronic. Stable on current medications.     ALPRAZolam (XANAX) 0.5 mg Oral Tablet     escitalopram oxalate (LEXAPRO) 20 mg Oral Tablet       3. 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 Recent increase in upper lumbar due to fall. Repeat imaging ordered.    cyclobenzaprine (FLEXERIL) 10 mg Oral Tablet     traMADoL (ULTRAM) 50 mg Oral Tablet     XR THORACIC SPINE 3 VIEWS     XR LUMBAR SPINE AP/OBLIQUES/LAT/SPOT      4. Type 2 diabetes mellitus without complication, with long-term current use of insulin (CMS HCC)  E11.9 Patient reports he has tried to be more mindful of his diet. Repeat labs ordered. Continue current medication until we are able to review labs.     glimepiride (AMARYL) 2 mg Oral Tablet    Z79.4 CBC/DIFF     COMPREHENSIVE METABOLIC PANEL, NON-FASTING     HGA1C (HEMOGLOBIN A1C WITH EST AVG GLUCOSE)     LIPID PANEL     MICROALBUMIN/CREATININE RATIO, URINE, RANDOM      5. Hypertension, unspecified type  I10 BP elevated here today but previously normal. Recommend home monitoring and follow up if persistently elevated. Could be secondary to recent diet or pain from back.     lisinopriL (PRINIVIL) 10 mg Oral Tablet      6. Anxiety  F41.9 Stable. Controlled on current medications.       7. Mixed hyperlipidemia due to type 2 diabetes mellitus (CMS HCC)  (CMS HCC)  E11.69 simvastatin (ZOCOR) 20 mg Oral Tablet    E78.2 CBC/DIFF     COMPREHENSIVE METABOLIC PANEL, NON-FASTING     LIPID PANEL      8. Concussion  S06.0XAA He reports multiple concussions with LOC, the most recent one within the last 3 months. Recommend MRI brain - will monitor and consider neurology or neuropsych referral/testing if indicated.    MRI BRAIN W/WO CONTRAST     CANCELED: MRI BRAIN W/WO CONTRAST      9. Inguinal hernia  K40.90 Patient requests referral back to Dr. Dewaine Conger who previously repaired inguinal hernia.     Referral to External Provider      10. Fall from ladder, initial encounter  W11.Lorne Skeens Will start with xrays checking thoracic spine as well as hardware of lumbar spine. Further recommendations may be made following imaging review.     XR THORACIC SPINE 3 VIEWS      XR LUMBAR SPINE AP/OBLIQUES/LAT/SPOT          Return in about 4 months (around 08/31/2023) for In Person Visit - CDM.    Arnell Sieving, DO     Portions of this note may be dictated using voice recognition software or a dictation service. Variances in spelling and vocabulary are possible and unintentional. Not all errors are caught/corrected. Please notify the Thereasa Parkin if any discrepancies are noted or if the meaning of any statement is not clear.

## 2023-05-03 LAB — CBC/DIFF
BASOPHILS: 1
BASOS ABS: 0.1 (ref 0.0–0.2)
EOS ABS: 0.2 (ref 0.0–0.4)
EOSINOPHIL: 2
HCT: 46.1 (ref 37.5–51.0)
HGB: 15.4 (ref 13.0–17.7)
LYMPHOCYTES: 30
LYMPHS ABS: 3.3 (ref 0.7–3.1)
MCH: 30.1 (ref 26.6–33.0)
MCHC: 33.4 (ref 31.5–35.7)
MCV: 90 (ref 79–97)
MONOCYTES: 8
MONOS ABS: 0.8 (ref 0.1–0.9)
PLATELET COUNT: 251 (ref 150–450)
RBC: 5.12 (ref 4.14–5.80)
RDW: 11.6 (ref 11.6–15.4)
WBC: 11 (ref 3.4–10.8)

## 2023-05-03 LAB — COMPREHENSIVE METABOLIC PANEL, NON-FASTING
ALBUMIN: 4.8 (ref 3.9–4.9)
ALKALINE PHOSPHATASE: 62 (ref 44–121)
ALT (SGPT): 29 (ref 0–44)
AST (SGOT): 22 (ref 0–40)
BILIRUBIN, TOTAL: 0.6 (ref 0.0–1.2)
BUN/CREAT RATIO: 13 (ref 10–24)
BUN: 12 (ref 8–27)
CALCIUM: 10 (ref 8.6–10.2)
CARBON DIOXIDE: 25 (ref 20–29)
CHLORIDE: 101 (ref 96–106)
CREATININE: 0.91 (ref 0.76–1.27)
ESTIMATED GLOMERULAR FILTRATION RATE: 94 (ref >59)
GLUCOSE,NONFAST: 199 (ref 70–99)
POTASSIUM: 4.4 (ref 3.5–5.2)
SODIUM: 138 (ref 134–144)
TOTAL PROTEIN: 7.1 (ref 6.0–8.5)

## 2023-05-03 LAB — MICROALBUMIN/CREATININE RATIO, URINE, RANDOM
CREATININE, UR RAND: 51
MICROALBUMIN RANDOM URINE: <3
MICROALBUMIN/CREAT RATIO: <6 (ref 0–29)

## 2023-05-03 LAB — LIPID PANEL
CHOLESTEROL: 135 (ref 100–199)
HDL-CHOLESTEROL: 35 (ref >39)
LDL (CALCULATED): 55 (ref 0–99)
TRIGLYCERIDES: 287 (ref 0–149)
VLDL (CALCULATED): 45 (ref 5–40)

## 2023-05-03 LAB — HGA1C (HEMOGLOBIN A1C WITH EST AVG GLUCOSE): HEMOGLOBIN A1C: 8 (ref 4.8–5.6)

## 2023-05-04 ENCOUNTER — Other Ambulatory Visit (RURAL_HEALTH_CENTER): Payer: Self-pay

## 2023-05-04 DIAGNOSIS — Z794 Long term (current) use of insulin: Secondary | ICD-10-CM

## 2023-05-04 DIAGNOSIS — E782 Mixed hyperlipidemia: Secondary | ICD-10-CM

## 2023-05-08 ENCOUNTER — Telehealth (RURAL_HEALTH_CENTER): Payer: Self-pay | Admitting: Family Medicine

## 2023-05-08 ENCOUNTER — Ambulatory Visit (HOSPITAL_COMMUNITY): Payer: Self-pay

## 2023-05-08 NOTE — Telephone Encounter (Signed)
Is there anyway you could sign Kerby Nora last office note so I can complete the referral? Thanks! [name]

## 2023-05-09 ENCOUNTER — Other Ambulatory Visit (RURAL_HEALTH_CENTER): Payer: Self-pay | Admitting: Family Medicine

## 2023-05-09 ENCOUNTER — Encounter (RURAL_HEALTH_CENTER): Payer: Self-pay | Admitting: Family Medicine

## 2023-05-09 ENCOUNTER — Telehealth (RURAL_HEALTH_CENTER): Payer: Self-pay | Admitting: Family Medicine

## 2023-05-09 DIAGNOSIS — M5126 Other intervertebral disc displacement, lumbar region: Secondary | ICD-10-CM

## 2023-05-09 DIAGNOSIS — Z794 Long term (current) use of insulin: Secondary | ICD-10-CM

## 2023-05-09 DIAGNOSIS — W11XXXA Fall on and from ladder, initial encounter: Secondary | ICD-10-CM

## 2023-05-09 MED ORDER — MOUNJARO 2.5 MG/0.5 ML SUBCUTANEOUS PEN INJECTOR
2.5000 mg | PEN_INJECTOR | SUBCUTANEOUS | 0 refills | Status: DC
Start: 2023-05-09 — End: 2023-05-10

## 2023-05-09 NOTE — Telephone Encounter (Signed)
ERROR

## 2023-05-10 ENCOUNTER — Encounter (RURAL_HEALTH_CENTER): Payer: Self-pay | Admitting: Family Medicine

## 2023-05-10 MED ORDER — SEMAGLUTIDE 0.25 MG OR 0.5 MG (2 MG/3 ML) SUBCUTANEOUS PEN INJECTOR
PEN_INJECTOR | SUBCUTANEOUS | 0 refills | Status: DC
Start: 2023-05-10 — End: 2023-09-04

## 2023-05-10 NOTE — Telephone Encounter (Signed)
s there anyway you could sign Shane Hines last office note so I can complete the referral? Thanks! [name]

## 2023-05-16 NOTE — Telephone Encounter (Signed)
 Is there anyway you could sign Kerby Nora last office note so I can complete the referral? Thanks! [name]

## 2023-05-17 IMAGING — MR MRI BRAIN WITHOUT AND WITH CONTRAST
10 of 11 series · 38 of 48 positions shown · IV contrast (gadavist)
Comparison: None available.

﻿EXAM:  [id number]   MRI BRAIN WITHOUT AND WITH CONTRAST
INDICATION: 67-year-old with history of multiple injuries.  Visual disturbance.
TECHNIQUE: Axial, coronal and sagittal images including diffusion sequence, FLAIR images and postcontrast images after administration of 10 mL Gadavist IV.

[Series 5: DWI · axial · 5.0mm · 1.35mm/px · z∈[-60,+66]mm · 9 of 88 slices shown (1 of 3)]
[im 1/88]
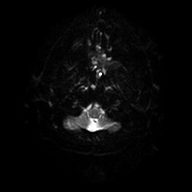
[im 15/88]
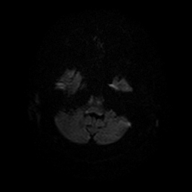
[im 30/88]
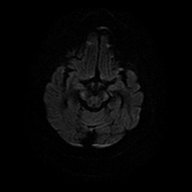
[im 37/88]
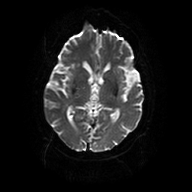
[im 44/88]
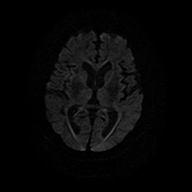
[im 51/88]
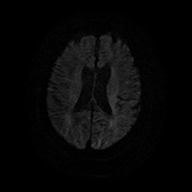
[im 59/88]
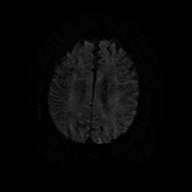
[im 73/88]
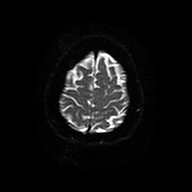
[im 88/88]
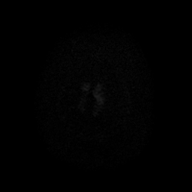

[Series 6: DWI · axial · 5.0mm · 1.35mm/px · z∈[-60,+66]mm · 3 of 22 slices shown (2 of 3)]
[im 1/22]
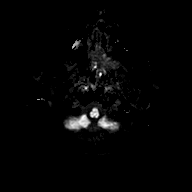
[im 11/22]
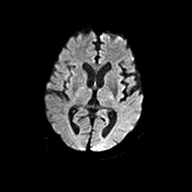
[im 22/22]
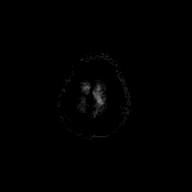

[Series 7: DWI · axial · 5.0mm · 1.35mm/px · z∈[-60,+66]mm · 3 of 22 slices shown (3 of 3)]
[im 1/22]
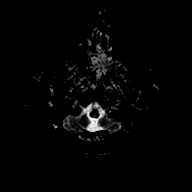
[im 11/22]
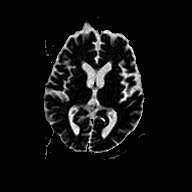
[im 22/22]
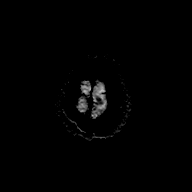

[Series 8: FLAIR · sagittal · 4.0mm · 0.75mm/px · 4 of 26 slices shown (1 of 2)]
[im 1/26]
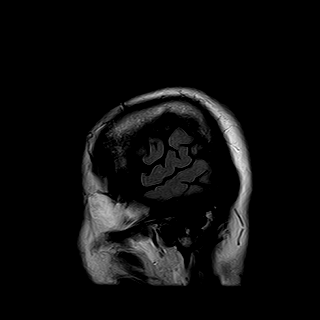
[im 9/26]
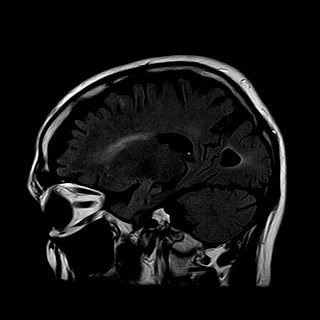
[im 17/26]
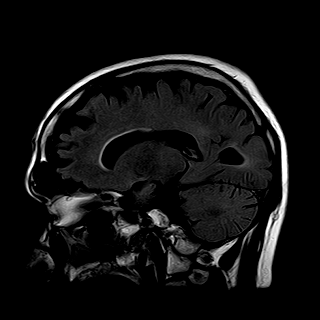
[im 26/26]
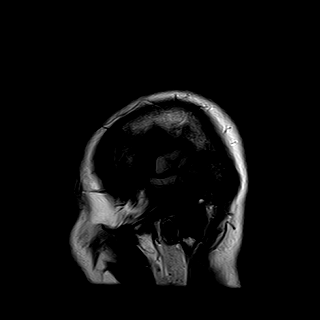

[Series 9: T2 · axial · 5.0mm · 0.43mm/px · z∈[-77,+60]mm · 4 of 24 slices shown (1 of 2)]
[im 1/24]
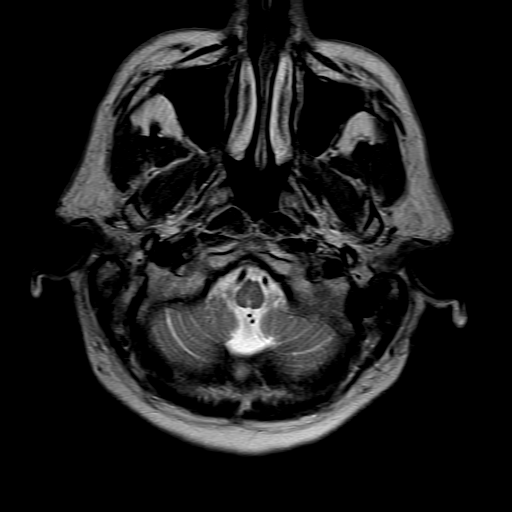
[im 8/24]
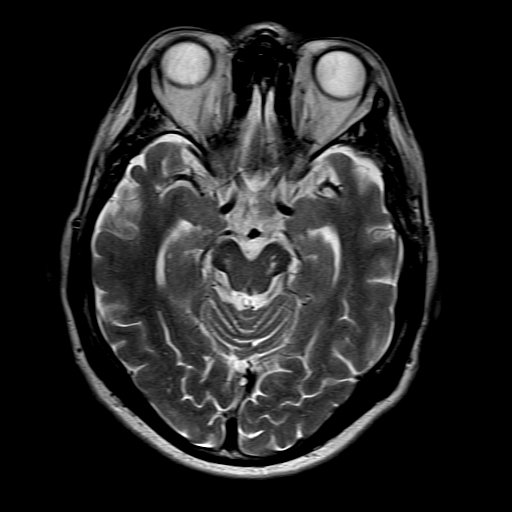
[im 16/24]
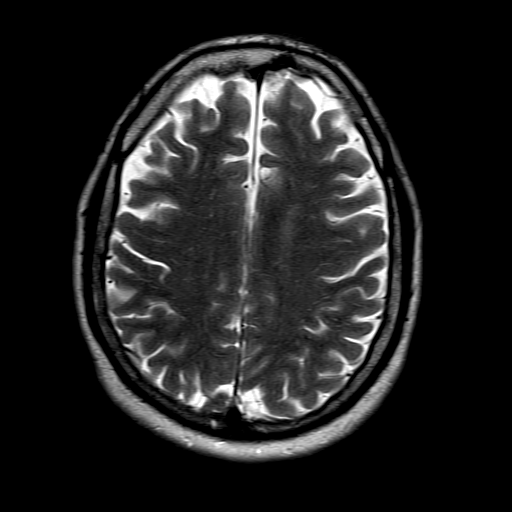
[im 24/24]
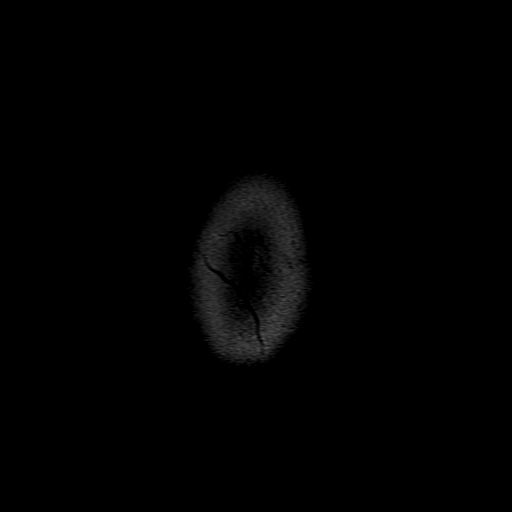

[Series 10: FLAIR · axial · 5.0mm · 0.76mm/px · z∈[-71,+54]mm · 3 of 22 slices shown (2 of 2)]
[im 1/22]
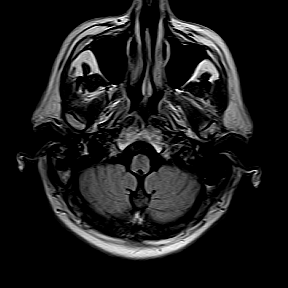
[im 11/22]
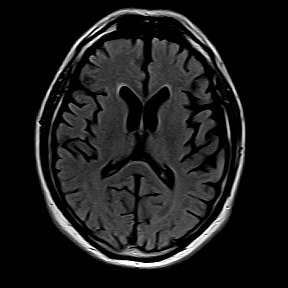
[im 22/22]
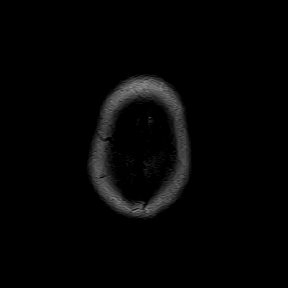

[Series 11: T1 · axial · 5.0mm · 0.69mm/px · z∈[-71,+54]mm · 3 of 22 slices shown]
[im 1/22]
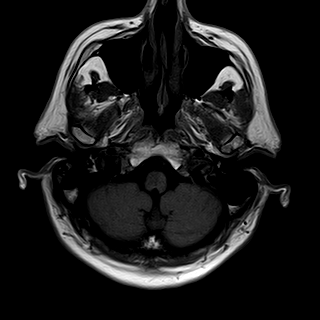
[im 11/22]
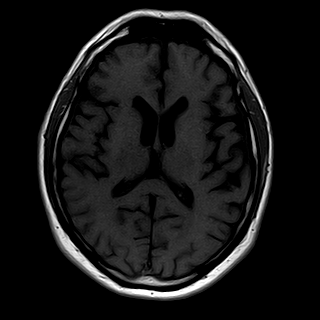
[im 22/22]
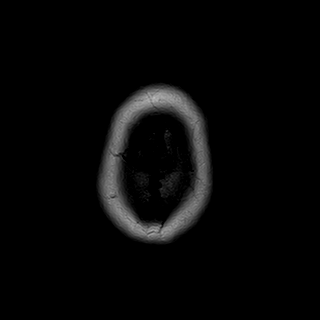

[Series 13: T2 · coronal · 6.0mm · 0.43mm/px · 4 of 24 slices shown (2 of 2)]
[im 1/24]
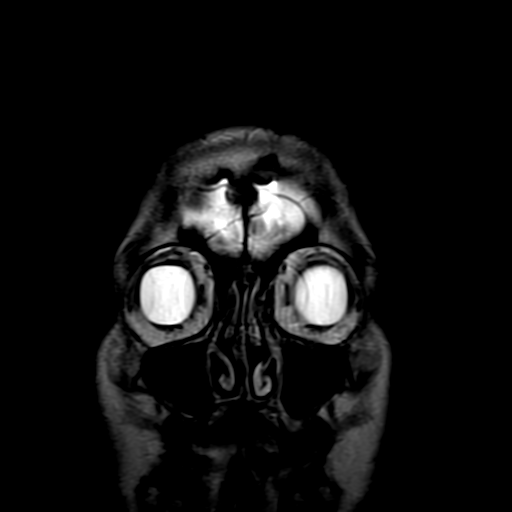
[im 8/24]
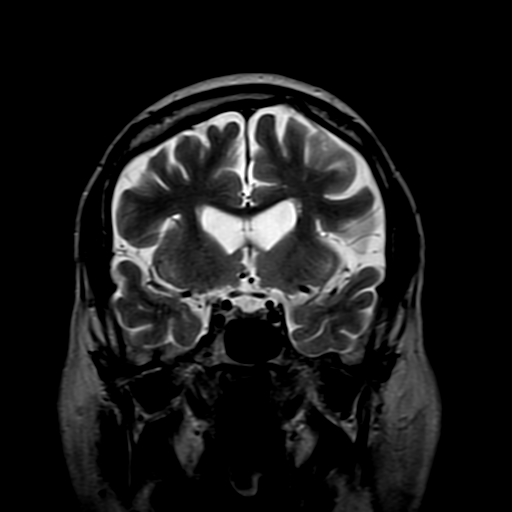
[im 16/24]
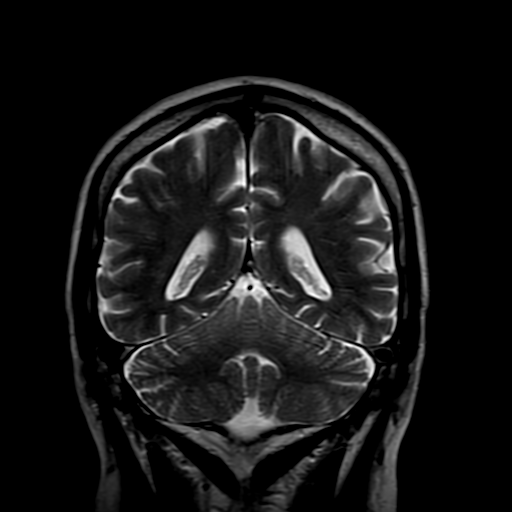
[im 24/24]
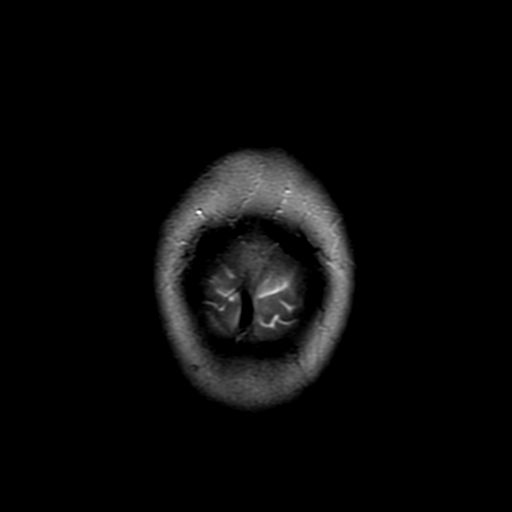

[Series 15: T1 post-contrast · axial · 5.0mm · 0.43mm/px · z∈[-80,+63]mm · 4 of 25 slices shown]
[im 1/25]
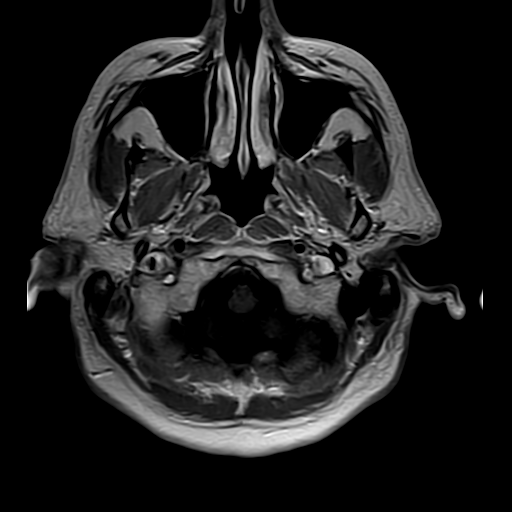
[im 9/25]
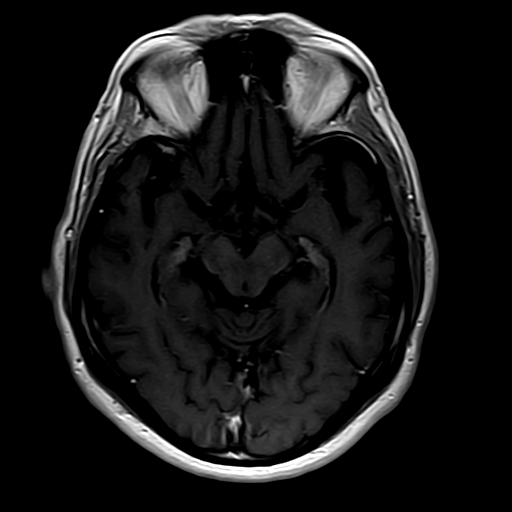
[im 17/25]
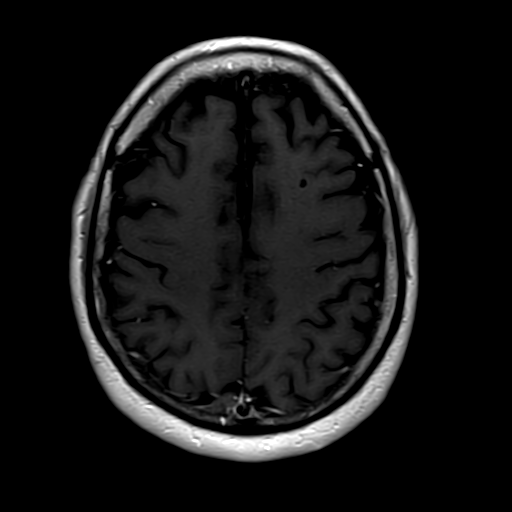
[im 25/25]
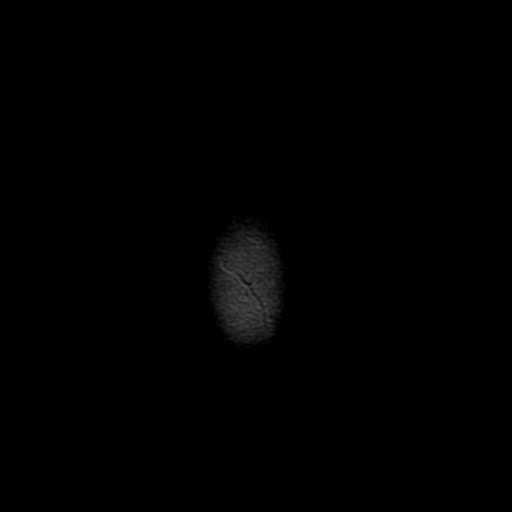

[Series 16: T1 fat-sat post-contrast · coronal · 5.5mm · 0.57mm/px · 1 of 24 slices shown]
[im 1/24]
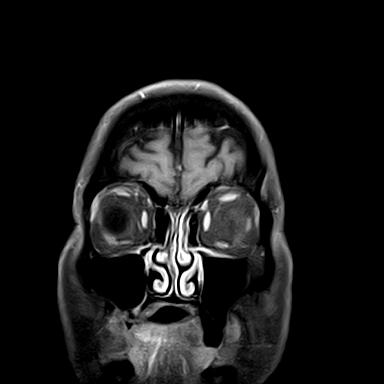

[38 of 48 positions shown; findings below may reference images not displayed]

FINDINGS: No evidence of acute ischemia on diffusion sequence. No intracranial bleed or extra-axial collections.  Mild symmetric, age-appropriate global cerebral atrophy. No ventriculomegaly or midline shift.

FLAIR images show no significant evidence of chronic ischemia or demyelinating process.  Few FLAIR bright lesions are seen on both sides in the periventricular white matter suggestive of small vessel ischemic changes.

No abnormal enhancement on postcontrast study.  Normal pituitary gland.  Paranasal sinuses and mastoids do not show acute findings.
IMPRESSION: 1. No intracranial space-occupying lesions or abnormal enhancement. 

2. Mild chronic small-vessel ischemic change of periventricular white matter.

## 2023-05-17 IMAGING — MR MRI THORACIC SPINE WITHOUT CONTRAST
4 of 5 series · 26 of 48 positions shown · IV contrast (gadolinium)
Comparison: T-spine x-ray dated 05/03/2023.

﻿EXAM:  [id number]   MRI THORACIC SPINE WITHOUT CONTRAST
INDICATION: 67-year-old with upper back pain. History of trauma due to fall in August.  No prior history of malignancy or back surgery.
TECHNIQUE: Multiplanar, multisequential MRI of the thoracic spine was performed without gadolinium contrast.

[Series 5: T2 · sagittal · 5.0mm · 0.78mm/px · 8 of 14 slices shown (1 of 2)]
[im 1/14]
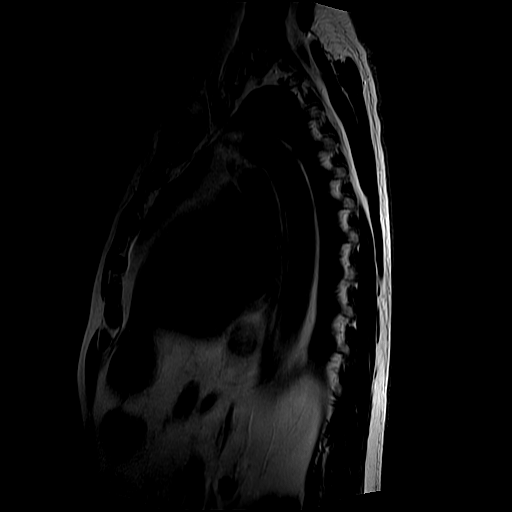
[im 2/14]
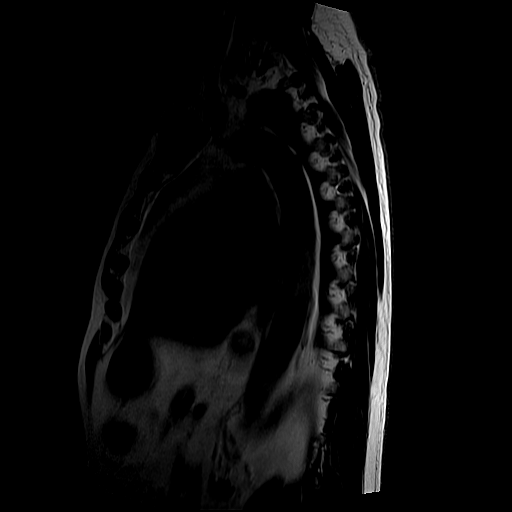
[im 5/14]
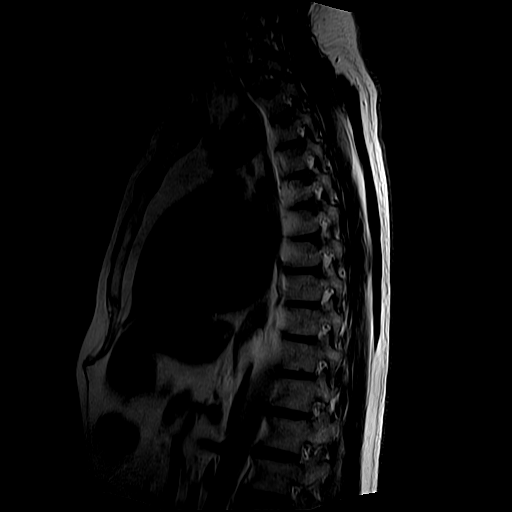
[im 6/14]
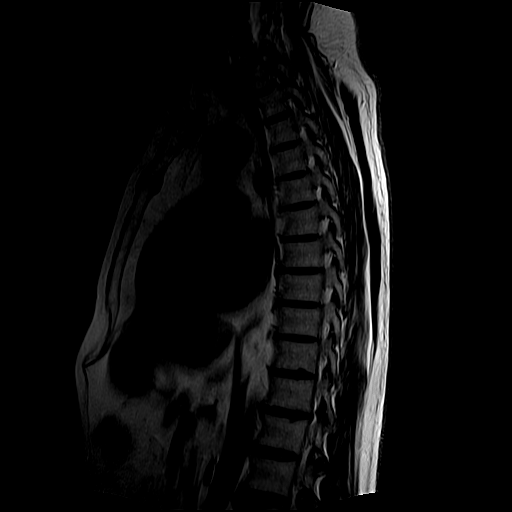
[im 8/14]
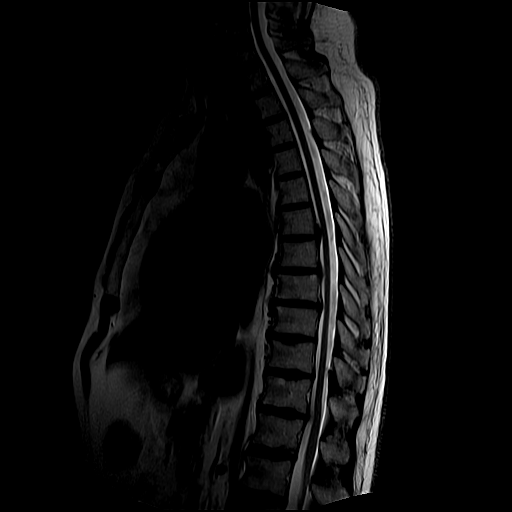
[im 9/14]
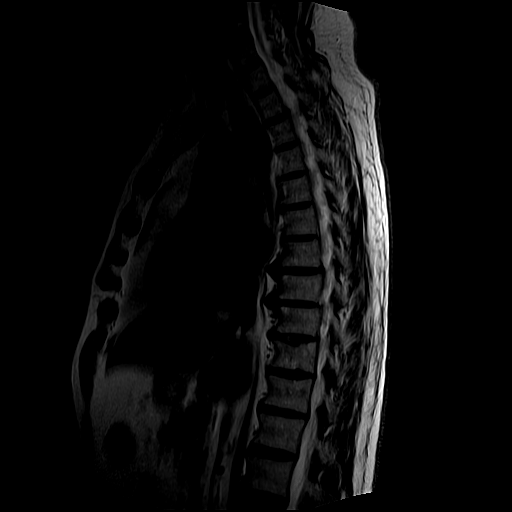
[im 12/14]
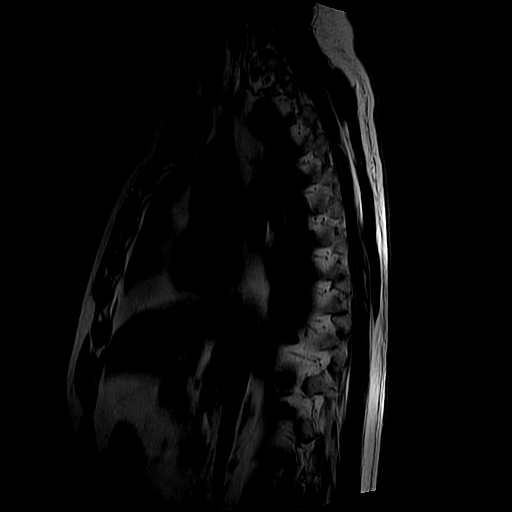
[im 14/14]
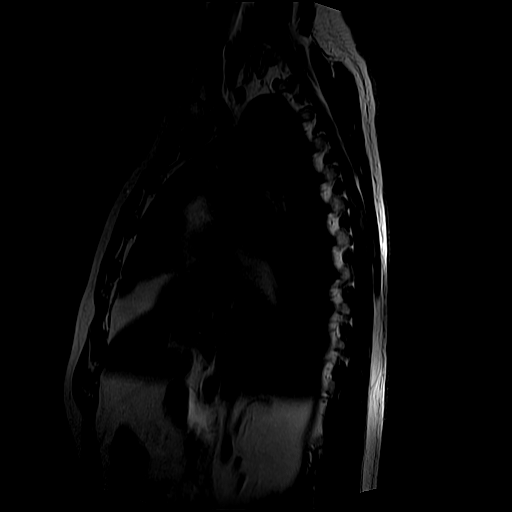

[Series 6: T1 · sagittal · 5.0mm · 0.78mm/px · 6 of 14 slices shown (1 of 2)]
[im 1/14]
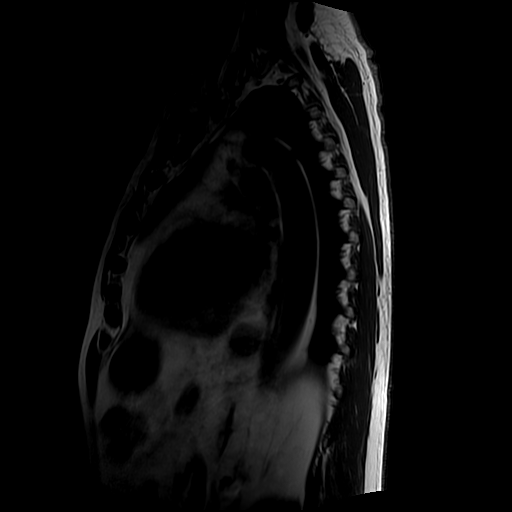
[im 2/14]
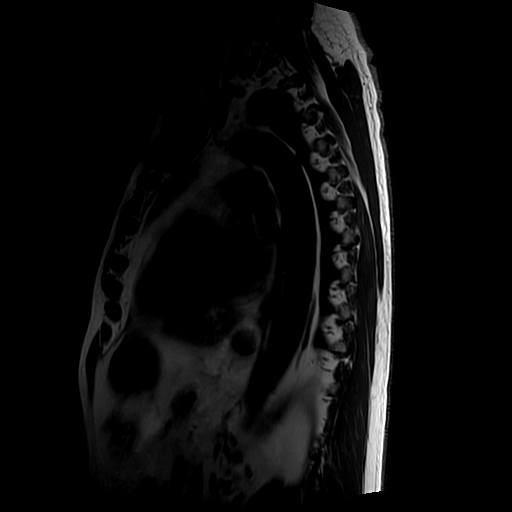
[im 5/14]
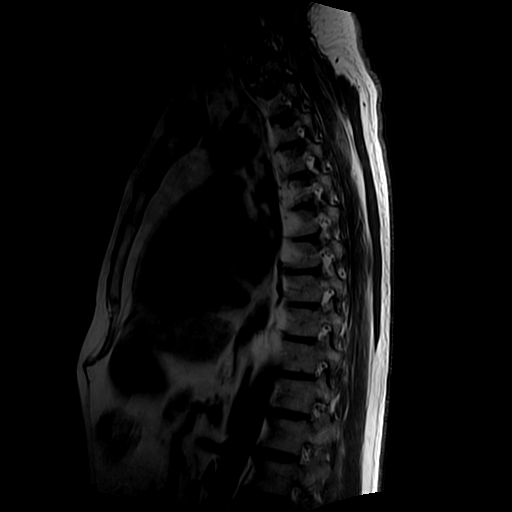
[im 6/14]
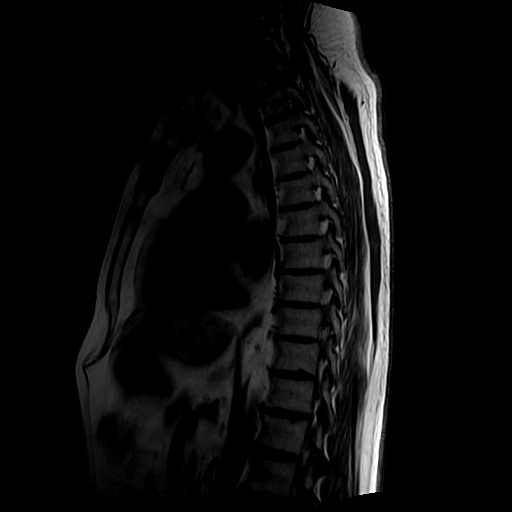
[im 8/14]
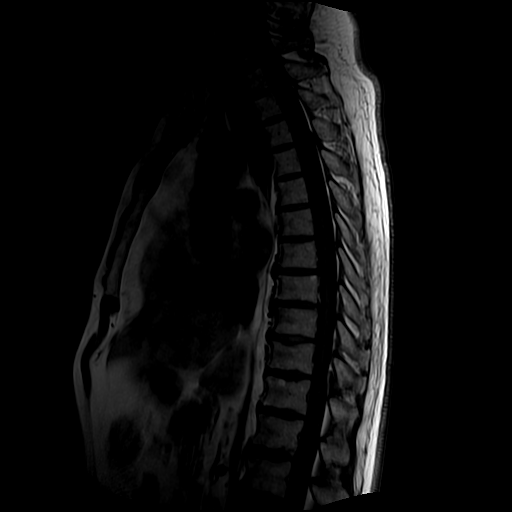
[im 12/14]
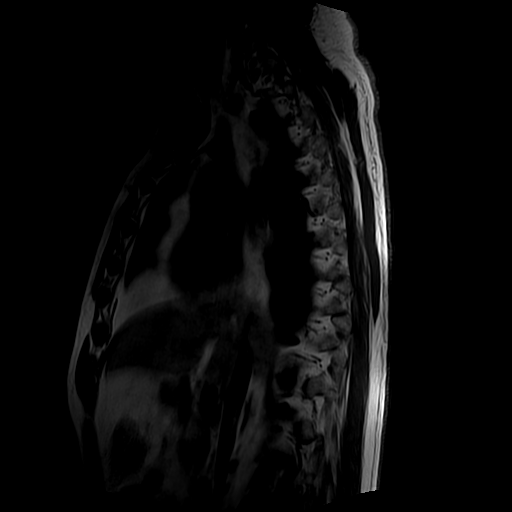

[Series 8: T2 · axial · 4.0mm · 0.62mm/px · z∈[-373,-114]mm · 9 of 13 slices shown (2 of 2)]
[im 1/13]
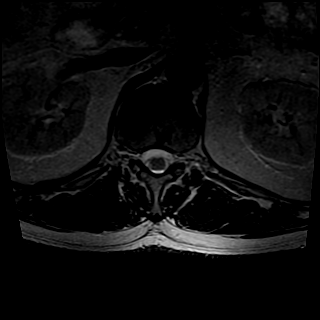
[im 2/13]
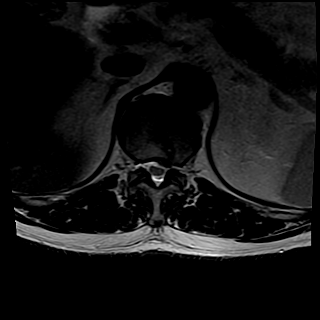
[im 4/13]
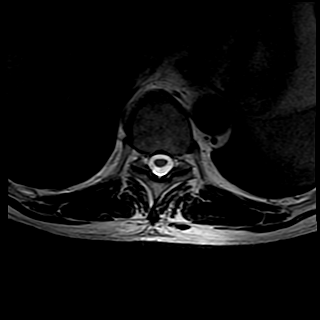
[im 5/13]
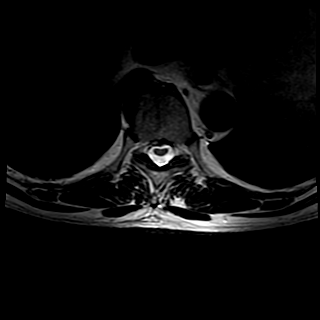
[im 7/13]
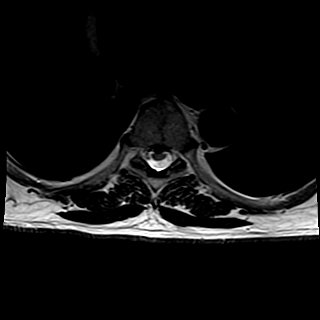
[im 8/13]
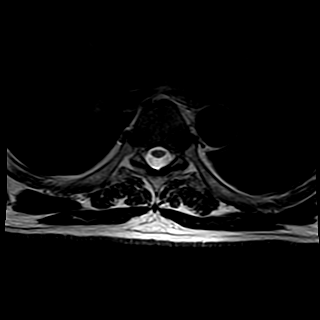
[im 10/13]
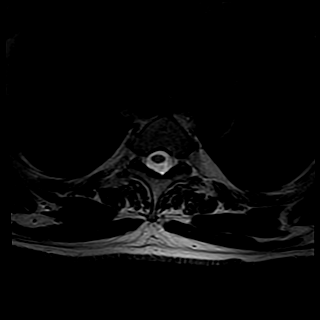
[im 11/13]
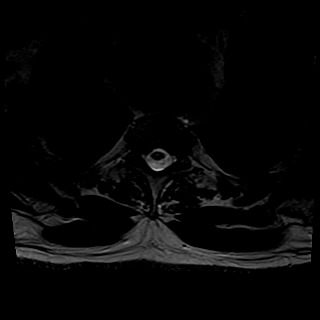
[im 13/13]
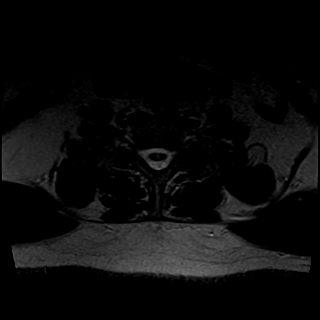

[Series 9: T1 · axial · 4.0mm · 0.62mm/px · z∈[-342,-154]mm · 3 of 13 slices shown (2 of 2)]
[im 2/13]
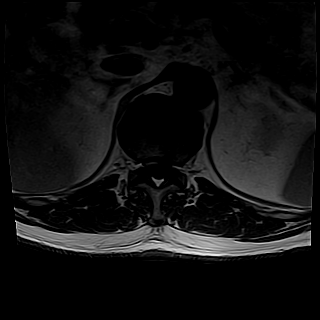
[im 7/13]
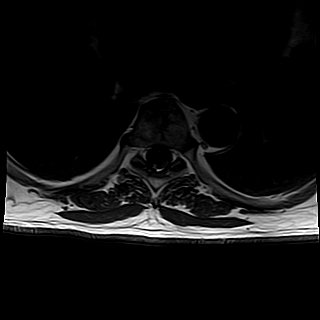
[im 11/13]
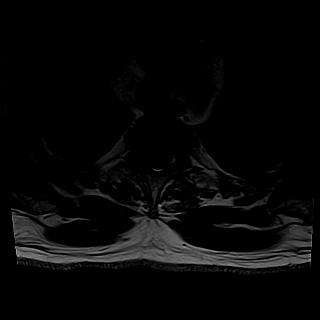

[26 of 48 positions shown; findings below may reference images not displayed]

FINDINGS: No acute or focal bone changes of thoracic vertebral bodies are noted.

Degenerative disc disease is noted at multiple mid and lower thoracic levels.  

At T6-7 level, 7-8 mm wide disc protrusion in the midline mildly impinges on thecal sac and the spinal cord in the midline.  Lesser degenerative changes at T7-8, T8-9 levels are noted. 

At T10-T11 level, degenerative disc changes and facet arthropathy are causing mild compromise of both lateral recess.

At T11-12 level, left paracentral disc protrusion 8 mm in width is noted causing moderately significant compromise of left lateral recess.

Thoracic spinal cord shows no focal abnormalities.  Paravertebral soft tissues are unremarkable.
IMPRESSION: 1. No acute or focal bone changes of thoracic vertebrae. 

2. Thoracic disc protrusions as described above at T6-7 and T11-12 levels.  Degenerative changes at other levels.  

3. No focal abnormalities of thoracic spinal cord.

## 2023-05-18 NOTE — Telephone Encounter (Signed)
Done

## 2023-05-18 NOTE — Telephone Encounter (Signed)
 Is there anyway you could sign Kerby Nora last office note so I can complete the referral? Thanks! [name]

## 2023-06-06 ENCOUNTER — Encounter (RURAL_HEALTH_CENTER): Payer: Self-pay | Admitting: Family Medicine

## 2023-06-06 ENCOUNTER — Telehealth (RURAL_HEALTH_CENTER): Payer: Self-pay | Admitting: Family Medicine

## 2023-06-06 NOTE — Telephone Encounter (Signed)
Please call patient:     I have reviewed the MRI completed at [facility] - at the T6-7 level there is a wide disc protrusion in the midline that impinges on the spinal cord. At T11-T12 there is also a left paracentral disc protrusion that is 8mm wide and is noted to be causing moderately significant compromise of the left lateral recess. There are also arthritic changes noted at the T10-T12 level.     How is his back feeling? Would he like to follow up with his previous neurosurgeon to discuss?

## 2023-06-14 ENCOUNTER — Encounter (RURAL_HEALTH_CENTER): Payer: Self-pay

## 2023-06-14 ENCOUNTER — Telehealth (RURAL_HEALTH_CENTER): Payer: Self-pay | Admitting: Family Medicine

## 2023-06-14 NOTE — Telephone Encounter (Signed)
Sent patient message with results from telephone encounter 06/06/23 see separate encounter.

## 2023-06-14 NOTE — Telephone Encounter (Signed)
Shane Hines has an appt 08/03/2023 @ 9 with Dr. Dewaine Conger. I spoke to the patient and is aware of the appt. [name]   Phone# [phone]

## 2023-07-19 ENCOUNTER — Telehealth (RURAL_HEALTH_CENTER): Payer: Self-pay | Admitting: Family Medicine

## 2023-07-19 DIAGNOSIS — Z794 Long term (current) use of insulin: Secondary | ICD-10-CM

## 2023-07-19 MED ORDER — METFORMIN ER 500 MG TABLET,EXTENDED RELEASE 24 HR
1000.0000 mg | ORAL_TABLET | Freq: Two times a day (BID) | ORAL | 0 refills | Status: DC
Start: 2023-07-19 — End: 2023-10-25

## 2023-07-19 NOTE — Telephone Encounter (Signed)
 Pharmacy request for refill

## 2023-08-09 ENCOUNTER — Other Ambulatory Visit (RURAL_HEALTH_CENTER): Payer: Self-pay | Admitting: Family Medicine

## 2023-08-09 DIAGNOSIS — Z789 Other specified health status: Secondary | ICD-10-CM

## 2023-08-09 DIAGNOSIS — Z794 Long term (current) use of insulin: Secondary | ICD-10-CM

## 2023-08-16 ENCOUNTER — Other Ambulatory Visit (RURAL_HEALTH_CENTER): Payer: Self-pay | Admitting: Family Medicine

## 2023-08-16 DIAGNOSIS — M5126 Other intervertebral disc displacement, lumbar region: Secondary | ICD-10-CM

## 2023-08-16 DIAGNOSIS — F411 Generalized anxiety disorder: Secondary | ICD-10-CM

## 2023-08-23 LAB — MUMPS VIRUS ANTIBODY, IGG, SERUM: MUMPS, IGG, AB, SCREEN: 227 (ref >10.9)

## 2023-08-23 LAB — MEASLES IGG: RUBEOLA (MEASLES) ANTIBODIES SCREEN, IGG, SERUM: >300 (ref >16.4)

## 2023-08-23 LAB — RUBELLA VIRUS ANTIBODIES, IGG, SERUM: RUBELLA IGG ANTIBODY: 10.2 [IU]/mL (ref >0.99)

## 2023-08-24 ENCOUNTER — Ambulatory Visit (RURAL_HEALTH_CENTER): Payer: Self-pay | Admitting: Family Medicine

## 2023-08-24 ENCOUNTER — Other Ambulatory Visit (RURAL_HEALTH_CENTER): Payer: Self-pay

## 2023-08-24 DIAGNOSIS — Z794 Long term (current) use of insulin: Secondary | ICD-10-CM

## 2023-08-24 DIAGNOSIS — Z789 Other specified health status: Secondary | ICD-10-CM

## 2023-09-03 ENCOUNTER — Other Ambulatory Visit: Payer: Self-pay

## 2023-09-04 ENCOUNTER — Other Ambulatory Visit: Payer: Self-pay

## 2023-09-04 ENCOUNTER — Encounter (RURAL_HEALTH_CENTER): Payer: Self-pay | Admitting: Family Medicine

## 2023-09-04 ENCOUNTER — Ambulatory Visit (RURAL_HEALTH_CENTER): Payer: Self-pay | Attending: Family Medicine | Admitting: Family Medicine

## 2023-09-04 VITALS — BP 139/77 | HR 105 | Temp 97.3°F | Resp 20 | Ht 70.0 in | Wt 204.0 lb

## 2023-09-04 DIAGNOSIS — I10 Essential (primary) hypertension: Secondary | ICD-10-CM

## 2023-09-04 DIAGNOSIS — E119 Type 2 diabetes mellitus without complications: Secondary | ICD-10-CM | POA: Insufficient documentation

## 2023-09-04 DIAGNOSIS — Z87891 Personal history of nicotine dependence: Secondary | ICD-10-CM | POA: Insufficient documentation

## 2023-09-04 DIAGNOSIS — Z7984 Long term (current) use of oral hypoglycemic drugs: Secondary | ICD-10-CM | POA: Insufficient documentation

## 2023-09-04 DIAGNOSIS — I152 Hypertension secondary to endocrine disorders: Secondary | ICD-10-CM | POA: Insufficient documentation

## 2023-09-04 DIAGNOSIS — C61 Malignant neoplasm of prostate: Secondary | ICD-10-CM

## 2023-09-04 DIAGNOSIS — E782 Mixed hyperlipidemia: Secondary | ICD-10-CM | POA: Insufficient documentation

## 2023-09-04 DIAGNOSIS — F411 Generalized anxiety disorder: Secondary | ICD-10-CM | POA: Insufficient documentation

## 2023-09-04 DIAGNOSIS — M5126 Other intervertebral disc displacement, lumbar region: Secondary | ICD-10-CM | POA: Insufficient documentation

## 2023-09-04 DIAGNOSIS — Z125 Encounter for screening for malignant neoplasm of prostate: Secondary | ICD-10-CM | POA: Insufficient documentation

## 2023-09-04 DIAGNOSIS — Z794 Long term (current) use of insulin: Secondary | ICD-10-CM | POA: Insufficient documentation

## 2023-09-04 DIAGNOSIS — E1159 Type 2 diabetes mellitus with other circulatory complications: Secondary | ICD-10-CM | POA: Insufficient documentation

## 2023-09-04 DIAGNOSIS — E1169 Type 2 diabetes mellitus with other specified complication: Secondary | ICD-10-CM | POA: Insufficient documentation

## 2023-09-04 DIAGNOSIS — Z7985 Long-term (current) use of injectable non-insulin antidiabetic drugs: Secondary | ICD-10-CM | POA: Insufficient documentation

## 2023-09-04 NOTE — Progress Notes (Unsigned)
 FAMILY MEDICINE, [facility]  [address] [place]  [place] [address]  Operated by [facility]     Name: Shane Hines MRN:  [id number]   Date: 09/04/2023 Age: 68 y.o.          Provider: Arnell Sieving, DO    Reason for visit: Follow Up 4 Months (No concerns /Starts PT next Monday)      History of Present Illness:  Shane Hines is a 68 y.o. male presents today for a 4 month follow up appointment. He reports that he has been doing well overall.    Back Pain:   Dr. Yetta Barre states everything looks great - no damage to spinal fusion. Other diseased discs are stable and overall unchanged from his last appointment.     Imaging:   06/07/2023: MRI Thoracic Spine:   No acute or focal bone changes of thoracic vertebrae  Thoracic disc protrusions as described above at T6-7 and T11-12 levels. Degenerative changes at other levels.   No focal abnormalities of thoracic spinal cord.     05/09/2023: Xray lumbar spine:   Significant degenerative disc changes of multiple mid and lower thoracic discs.   Postoperative changes of L4-S1 disc with posterior fixation hardware.  Moderate degenerative disc changes of mid lumbar discs.     Diabetes:   ~2012  Glucose this am was 140 fasting. He reports it has been as low as 130. He states he is not monitoring every day but does check it throughout the week.   He states he has been checking it once to twice/day. He denies hypoglycemia episodes. He has lost weight since his last appointment.  Metformin and Glimepiride (increased to BID)  Previously ordered Ozempic and jardiance but unable to obtain these due to cost.      Hypertension:   No home monitoring reported. No chest pain or shortness of breath.   No feeling like BP is elevated or bottoming out - he states his HR has always been elevated.  Lisinopril 10 mg which he is tolerating well.      Back Surgery 2014  Spinal fusion that didn't heal completely with 3 currently herniated disks  Continues to  follow with Dr. Yetta Barre in [place], [place]  Tramadol helps some. Flexeril at least every am - sometimes up to 3 times/day. He denies drowsiness or fatigue with either of these medications.     Anxiety:   Stable overall. He feels that the medications are working well. He denies drowsiness or side effects.  Lexapro 20 mg  Xanax 0.5 twice daily prn     Right Inguinal Hernia Repair:  Scar tissue compressing nerve but original hernia repair still normal.   He did follow up with Dr. Dewaine Conger in evaluation.    Historical Data    Past Medical History:  Past Medical History:   Diagnosis Date    Anxiety 05/09/2022    Diabetes mellitus, type 2 05/09/2022    Hypertension 05/09/2022    Spinal stenosis 05/09/2022    Spinal stenosis 05/09/2022       Allergies:  Allergies   Allergen Reactions    Penicillins      Unknown - reaction as a child     Medications:  Current Outpatient Medications   Medication Sig    ALPRAZolam (XANAX) 0.5 mg Oral Tablet TAKE ONE TABLET BY MOUTH TWICE DAILY AS NEEDED    cyclobenzaprine (FLEXERIL) 10 mg Oral Tablet Take 1 Tablet (10 mg total) by  mouth Three times a day    escitalopram oxalate (LEXAPRO) 20 mg Oral Tablet Take 1 Tablet (20 mg total) by mouth Once a day    glimepiride (AMARYL) 2 mg Oral Tablet Take 1 Tablet (2 mg total) by mouth Twice a day before meals    lisinopriL (PRINIVIL) 10 mg Oral Tablet Take 1 Tablet (10 mg total) by mouth Once a day    metFORMIN (GLUCOPHAGE XR) 500 mg Oral Tablet Sustained Release 24 hr Take 2 Tablets (1,000 mg total) by mouth Twice daily    semaglutide (OZEMPIC) 0.25 mg or 0.5 mg (2 mg/3 mL) Subcutaneous Pen Injector Inject 0.25 mg under the skin Every 7 days for 28 days, THEN 0.5 mg Every 7 days for 14 days. Indications: type 2 diabetes mellitus.    simvastatin (ZOCOR) 20 mg Oral Tablet Take 1 Tablet (20 mg total) by mouth Every night    traMADoL (ULTRAM) 50 mg Oral Tablet TAKE ONE TABLET BY MOUTH THREE TIMES DAILY AS NEEDED     Social History:  Social History      Socioeconomic History    Marital status: Married   Occupational History    Occupation: DISABLED   Tobacco Use    Smoking status: Former     Types: Cigarettes    Smokeless tobacco: Never   Vaping Use    Vaping status: Never Used   Substance and Sexual Activity    Alcohol use: Yes     Alcohol/week: 5.0 standard drinks of alcohol     Types: 2 Glasses of wine, 3 Cans of beer per week     Comment: occassionally    Drug use: Never           Review of Systems:  Other than ROS in the HPI, all other systems were negative.    Physical Exam:  Vital Signs:  Vitals:    09/04/23 1353   BP: 139/77   Pulse: (!) 105   Resp: 20   Temp: 36.3 C (97.3 F)   SpO2: 95%   Weight: 92.5 kg (204 lb)   Height: 1.778 m (5\' 10" )   BMI: 29.27     Physical Exam  Vitals and nursing note reviewed.   Constitutional:       General: He is not in acute distress.     Appearance: Normal appearance.   HENT:      Mouth/Throat:      Mouth: Mucous membranes are moist.   Neck:      Vascular: No carotid bruit.   Cardiovascular:      Rate and Rhythm: Normal rate and regular rhythm.      Pulses: Normal pulses.      Heart sounds: Normal heart sounds. No murmur heard.  Pulmonary:      Effort: Pulmonary effort is normal.      Breath sounds: Normal breath sounds.   Abdominal:      General: Bowel sounds are normal.      Palpations: Abdomen is soft.      Tenderness: There is no abdominal tenderness. There is no guarding.   Musculoskeletal:         General: No swelling or deformity.      Cervical back: Neck supple. No tenderness.      Right lower leg: No edema.      Left lower leg: No edema.   Lymphadenopathy:      Cervical: No cervical adenopathy.   Skin:     General: Skin is warm and  dry.   Neurological:      General: No focal deficit present.      Mental Status: He is alert and oriented to person, place, and time.   Psychiatric:         Mood and Affect: Mood normal.         Behavior: Behavior normal.         Thought Content: Thought content normal.          Previously completed labs, imaging studies and medical records were reviewed as part of this office visit.     Assessment/Plan:    Depression screening is negative. PHQ 2 Total: 0         ICD-10-CM    1. Type 2 diabetes mellitus without complication, with long-term current use of insulin  E11.9 Chronic.   A1C has been trending around 7 despite reported diet changes and medication adjustments.   Labs ordered and further recommendations may be made following lab review.   Continue to increase exercise which he is hoping PT will help with.     CBC/DIFF    Z79.4 COMPREHENSIVE METABOLIC PANEL, NON-FASTING     HGA1C (HEMOGLOBIN A1C WITH EST AVG GLUCOSE)     MICROALBUMIN/CREATININE RATIO, URINE, RANDOM      2. Hypertension associated with diabetes (CMS HCC)  (CMS HCC)  E11.59 Chronic. Stable.   Continue current medications as previously prescribed.   Will continue to monitor HR.     CBC/DIFF    I15.2 COMPREHENSIVE METABOLIC PANEL, NON-FASTING      3. Mixed hyperlipidemia due to type 2 diabetes mellitus (CMS HCC)  (CMS HCC)  E11.69 Chronic. Stable.   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 Yetta Barre  Start PT next week and follow up after completion.     [facility] was last reviewed on 09/05/2023  7:57 AM by Dom Haverland, Luisa Hart and result was REVIEWED PDMP.     Based on this, I have no concerns for prescribing controlled substances at this time.        5. GAD (generalized anxiety disorder)  F41.1 Stable on current medications.       6. Prostate cancer screening  Z12.5 PSA SCREENING            Return in about 4 months (around 01/04/2024) for In Person Visit - CDM.    Arnell Sieving, DO     Portions of this note may be dictated using voice recognition software or a dictation service. Variances in spelling and vocabulary are possible and unintentional. Not all errors are caught/corrected. Please notify the  Thereasa Parkin if any discrepancies are noted or if the meaning of any statement is not clear.

## 2023-09-05 DIAGNOSIS — E782 Mixed hyperlipidemia: Secondary | ICD-10-CM | POA: Insufficient documentation

## 2023-09-11 LAB — COMPREHENSIVE METABOLIC PANEL, NON-FASTING
ALBUMIN: 5 (ref 3.9–4.9)
ALKALINE PHOSPHATASE: 58 (ref 44–121)
ALT (SGPT): 51 (ref 0–44)
AST (SGOT): 42 (ref 0–40)
BILIRUBIN, TOTAL: 0.5 (ref 0.0–1.2)
BUN/CREAT RATIO: 17 (ref 10–24)
BUN: 17 (ref 8–27)
CALCIUM: 9.8 (ref 8.6–10.2)
CARBON DIOXIDE: 20 (ref 20–29)
CHLORIDE: 102 (ref 96–106)
CREATININE: 0.99 (ref 0.76–1.27)
ESTIMATED GLOMERULAR FILTRATION RATE: 85 (ref >59)
GLUCOSE,NONFAST: 168 (ref 70–99)
POTASSIUM: 4.7 (ref 3.5–5.2)
SODIUM: 140 (ref 134–144)
TOTAL PROTEIN: 7.5 (ref 6.0–8.5)

## 2023-09-11 LAB — LIPID PANEL
CHOLESTEROL: 156 (ref 100–199)
HDL-CHOLESTEROL: 34 (ref >39)
LDL (CALCULATED): 69 (ref 0–99)
TRIGLYCERIDES: 335 (ref 0–149)
VLDL (CALCULATED): 53 (ref 5–40)

## 2023-09-11 LAB — CBC/DIFF
BASOPHILS: 1
BASOS ABS: 0 (ref 0.0–0.2)
EOS ABS: 0.3 (ref 0.0–0.4)
EOSINOPHIL: 4
HCT: 46.2 (ref 37.5–51.0)
HGB: 15.5 (ref 13.0–17.7)
LYMPHOCYTES: 35
LYMPHS ABS: 2.6 (ref 0.7–3.1)
MCH: 30.2 (ref 26.6–33.0)
MCHC: 33.5 (ref 31.5–35.7)
MCV: 90 (ref 79–97)
MONOCYTES: 9
MONOS ABS: 0.7 (ref 0.1–0.9)
PLATELET COUNT: 250 (ref 150–450)
RBC: 5.13 (ref 4.14–5.80)
RDW: 12.1 (ref 11.6–15.4)
WBC: 7.5 (ref 3.4–10.8)

## 2023-09-11 LAB — PSA SCREENING: PROSTATE SPECIFIC AG: 0.5 (ref 0.0–4.0)

## 2023-09-11 LAB — HGA1C (HEMOGLOBIN A1C WITH EST AVG GLUCOSE): HEMOGLOBIN A1C: 7.2 (ref 4.8–5.6)

## 2023-09-11 LAB — MICROALBUMIN/CREATININE RATIO, URINE, RANDOM
CREATININE, UR RAND: 73.6
MICROALBUMIN RANDOM URINE: <3
MICROALBUMIN/CREAT RATIO: <4 (ref 0–29)

## 2023-09-12 ENCOUNTER — Other Ambulatory Visit (RURAL_HEALTH_CENTER): Payer: Self-pay

## 2023-09-12 DIAGNOSIS — Z125 Encounter for screening for malignant neoplasm of prostate: Secondary | ICD-10-CM

## 2023-09-12 DIAGNOSIS — E119 Type 2 diabetes mellitus without complications: Secondary | ICD-10-CM

## 2023-09-12 DIAGNOSIS — E782 Mixed hyperlipidemia: Secondary | ICD-10-CM

## 2023-09-12 DIAGNOSIS — I152 Hypertension secondary to endocrine disorders: Secondary | ICD-10-CM

## 2023-10-19 ENCOUNTER — Other Ambulatory Visit (RURAL_HEALTH_CENTER): Payer: Self-pay | Admitting: Family Medicine

## 2023-10-19 DIAGNOSIS — E782 Mixed hyperlipidemia: Secondary | ICD-10-CM

## 2023-10-19 MED ORDER — SIMVASTATIN 20 MG TABLET
20.0000 mg | ORAL_TABLET | Freq: Every evening | ORAL | 0 refills | Status: DC
Start: 2023-10-19 — End: 2024-01-08

## 2023-10-25 ENCOUNTER — Other Ambulatory Visit (RURAL_HEALTH_CENTER): Payer: Self-pay | Admitting: Family Medicine

## 2023-10-25 DIAGNOSIS — Z794 Long term (current) use of insulin: Secondary | ICD-10-CM

## 2023-10-25 MED ORDER — METFORMIN ER 500 MG TABLET,EXTENDED RELEASE 24 HR
1000.0000 mg | ORAL_TABLET | Freq: Two times a day (BID) | ORAL | 0 refills | Status: DC
Start: 2023-10-25 — End: 2024-01-08

## 2023-11-11 ENCOUNTER — Encounter (RURAL_HEALTH_CENTER): Payer: Self-pay | Admitting: Family Medicine

## 2023-11-13 ENCOUNTER — Other Ambulatory Visit (RURAL_HEALTH_CENTER): Payer: Self-pay | Admitting: Family Medicine

## 2023-11-13 DIAGNOSIS — M5126 Other intervertebral disc displacement, lumbar region: Secondary | ICD-10-CM

## 2023-11-13 DIAGNOSIS — M4800 Spinal stenosis, site unspecified: Secondary | ICD-10-CM

## 2023-11-13 DIAGNOSIS — M1990 Unspecified osteoarthritis, unspecified site: Secondary | ICD-10-CM

## 2023-11-21 ENCOUNTER — Other Ambulatory Visit (RURAL_HEALTH_CENTER): Payer: Self-pay | Admitting: Family Medicine

## 2023-11-21 DIAGNOSIS — F411 Generalized anxiety disorder: Secondary | ICD-10-CM

## 2023-11-21 DIAGNOSIS — M5126 Other intervertebral disc displacement, lumbar region: Secondary | ICD-10-CM

## 2023-11-22 MED ORDER — TRAMADOL 50 MG TABLET
1.0000 | ORAL_TABLET | Freq: Three times a day (TID) | ORAL | 2 refills | Status: DC
Start: 2023-11-22 — End: 2024-02-20

## 2023-11-22 MED ORDER — CYCLOBENZAPRINE 10 MG TABLET: 10.0000 mg | ORAL_TABLET | Freq: Three times a day (TID) | ORAL | 1 refills | Status: DC

## 2023-11-22 MED ORDER — ALPRAZOLAM 0.5 MG TABLET
0.5000 mg | ORAL_TABLET | Freq: Two times a day (BID) | ORAL | 2 refills | Status: DC
Start: 2023-11-22 — End: 2024-01-08

## 2023-12-19 ENCOUNTER — Telehealth (RURAL_HEALTH_CENTER): Payer: Self-pay | Admitting: Family Medicine

## 2023-12-19 NOTE — Telephone Encounter (Signed)
 Shane Hines made an appt 12/26/2023 @ 8 with [facility]. jlester  PH# [phone]

## 2024-01-08 ENCOUNTER — Encounter (RURAL_HEALTH_CENTER): Payer: Self-pay | Admitting: Family Medicine

## 2024-01-08 ENCOUNTER — Ambulatory Visit (RURAL_HEALTH_CENTER): Payer: Self-pay | Attending: Family Medicine | Admitting: Family Medicine

## 2024-01-08 ENCOUNTER — Other Ambulatory Visit: Payer: Self-pay

## 2024-01-08 DIAGNOSIS — Z87891 Personal history of nicotine dependence: Secondary | ICD-10-CM | POA: Insufficient documentation

## 2024-01-08 DIAGNOSIS — E1169 Type 2 diabetes mellitus with other specified complication: Secondary | ICD-10-CM | POA: Insufficient documentation

## 2024-01-08 DIAGNOSIS — R634 Abnormal weight loss: Secondary | ICD-10-CM | POA: Insufficient documentation

## 2024-01-08 DIAGNOSIS — F411 Generalized anxiety disorder: Secondary | ICD-10-CM | POA: Insufficient documentation

## 2024-01-08 DIAGNOSIS — M5126 Other intervertebral disc displacement, lumbar region: Secondary | ICD-10-CM | POA: Insufficient documentation

## 2024-01-08 DIAGNOSIS — Z7984 Long term (current) use of oral hypoglycemic drugs: Secondary | ICD-10-CM | POA: Insufficient documentation

## 2024-01-08 DIAGNOSIS — M25569 Pain in unspecified knee: Secondary | ICD-10-CM | POA: Insufficient documentation

## 2024-01-08 DIAGNOSIS — Z79899 Other long term (current) drug therapy: Secondary | ICD-10-CM | POA: Insufficient documentation

## 2024-01-08 DIAGNOSIS — G479 Sleep disorder, unspecified: Secondary | ICD-10-CM | POA: Insufficient documentation

## 2024-01-08 DIAGNOSIS — Z794 Long term (current) use of insulin: Secondary | ICD-10-CM | POA: Insufficient documentation

## 2024-01-08 DIAGNOSIS — E119 Type 2 diabetes mellitus without complications: Secondary | ICD-10-CM | POA: Insufficient documentation

## 2024-01-08 DIAGNOSIS — F32A Depression, unspecified: Secondary | ICD-10-CM | POA: Insufficient documentation

## 2024-01-08 DIAGNOSIS — E782 Mixed hyperlipidemia: Secondary | ICD-10-CM | POA: Insufficient documentation

## 2024-01-08 DIAGNOSIS — I10 Essential (primary) hypertension: Secondary | ICD-10-CM | POA: Insufficient documentation

## 2024-01-08 DIAGNOSIS — Z23 Encounter for immunization: Secondary | ICD-10-CM | POA: Insufficient documentation

## 2024-01-08 MED ORDER — ALPRAZOLAM 0.5 MG TABLET
0.5000 mg | ORAL_TABLET | Freq: Three times a day (TID) | ORAL | 0 refills | Status: DC | PRN
Start: 2024-01-08 — End: 2024-03-20

## 2024-01-08 MED ORDER — LISINOPRIL 10 MG TABLET
10.0000 mg | ORAL_TABLET | Freq: Every day | ORAL | 1 refills | Status: AC
Start: 2024-01-08 — End: ?

## 2024-01-08 MED ORDER — SIMVASTATIN 20 MG TABLET
20.0000 mg | ORAL_TABLET | Freq: Every evening | ORAL | 1 refills | Status: AC
Start: 2024-01-08 — End: ?

## 2024-01-08 MED ORDER — GLIMEPIRIDE 2 MG TABLET
2.0000 mg | ORAL_TABLET | Freq: Two times a day (BID) | ORAL | 1 refills | Status: AC
Start: 2024-01-08 — End: ?

## 2024-01-08 MED ORDER — ESCITALOPRAM 20 MG TABLET
20.0000 mg | ORAL_TABLET | Freq: Every day | ORAL | 1 refills | Status: AC
Start: 2024-01-08 — End: ?

## 2024-01-08 MED ORDER — METFORMIN ER 500 MG TABLET,EXTENDED RELEASE 24 HR
1000.0000 mg | ORAL_TABLET | Freq: Two times a day (BID) | ORAL | 1 refills | Status: AC
Start: 2024-01-08 — End: ?

## 2024-01-08 NOTE — Progress Notes (Signed)
 FAMILY MEDICINE, [facility]  [address] [place]  [place] [address]  Operated by [facility]     Name: Shane Hines MRN:  [id number]   Date: 01/08/2024 Age: 68 y.o.          Provider: Harlene Spire, DO    Reason for visit: Follow Up 4 Months (No concerns)      History of Present Illness  Shane Hines is a 68 year old male who presents for a routine follow-up visit.    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    Blood pressure status  - Recent blood pressure reading 111/62 mmHg    Musculoskeletal pain  - Intermittent back pain relieved by S-stem device  - Knee pain improved with S-stem device, allowing easier transition from floor to standing    Psychological symptoms and sleep disturbance  - Currently taking Lexapro  and Xanax  for stress management  - Xanax  taken twice daily (morning and night); sometimes requires an additional afternoon dose  - Difficulty sleeping if Xanax  is not taken at night    Immunization status  - Due for pneumonia vaccine  - Uncertain tetanus vaccination status; last possible dose approximately 11 years ago    Gastrointestinal symptoms  - No changes in stool  - No black or tarry stools        PHQ Questionnaire  Little interest or pleasure in doing things.: Not at all  Feeling down, depressed, or hopeless: Not at all  PHQ 2 Total: 0  Trouble falling or staying asleep, or sleeping too much.: Several Days  Feeling tired or having little energy: Not at all  Poor appetite or overeating: Not at all  Feeling bad about yourself/ that you are a failure in the past 2 weeks?: Not at all  Trouble concentrating on things in the past 2 weeks?: Not at all  Moving/Speaking slowly or being fidgety or restless  in the past 2 weeks?: Not at all  Thoughts that you would be  better off DEAD, or of hurting yourself in some way.: Not at all  If you checked off any problems, how difficult have these problems made it for you to do your work, take care of things at home, or get along with other people?: Not difficult at all  PHQ 9 Total: 1  Interpretation of Total Score: 0-4 No depression        01/08/2024     1:50 PM   GAD-7 Questionnaire   Feeling nervous,anxious,on edge 3   Not being able to stop or control worrying 0   Worrying too much about different things 0   Trouble relaxing 0   Being so restless that it is hard to sit still 0   Becoming easily annoyed or irritable 0   Feeling afraid as if something awful might happen 0   How difficult have these problems made it for you to work, take care of things at home, or get along with other people? Not difficult at all   Gad-7 Score Total 3   Interpretation 0-4, normal         Historical Data    Past Medical History:  Past Medical History:   Diagnosis Date    Anxiety 05/09/2022    Diabetes mellitus, type 2 05/09/2022  Hypertension 05/09/2022    Spinal stenosis 05/09/2022    Spinal stenosis 05/09/2022         Allergies:  Allergies[1]  Medications:  Current Outpatient Medications   Medication Sig    ALPRAZolam  (XANAX ) 0.5 mg Oral Tablet Take 1 Tablet (0.5 mg total) by mouth Twice daily    cyclobenzaprine  (FLEXERIL ) 10 mg Oral Tablet Take 1 Tablet (10 mg total) by mouth Three times a day    escitalopram  oxalate (LEXAPRO ) 20 mg Oral Tablet Take 1 Tablet (20 mg total) by mouth Once a day    glimepiride  (AMARYL ) 2 mg Oral Tablet Take 1 Tablet (2 mg total) by mouth Twice a day before meals    lisinopriL  (PRINIVIL ) 10 mg Oral Tablet Take 1 Tablet (10 mg total) by mouth Once a day    metFORMIN  (GLUCOPHAGE  XR) 500 mg Oral Tablet Sustained Release 24 hr Take 2 Tablets (1,000 mg total) by mouth Twice daily    simvastatin  (ZOCOR ) 20 mg Oral Tablet Take 1 Tablet (20 mg total) by mouth Every night    traMADoL  (ULTRAM ) 50 mg Oral Tablet Take 1 Tablet (50  mg total) by mouth Three times a day     Social History:  Social History     Socioeconomic History    Marital status: Married   Occupational History    Occupation: DISABLED   Tobacco Use    Smoking status: Former     Types: Cigarettes    Smokeless tobacco: Never   Vaping Use    Vaping status: Never Used   Substance and Sexual Activity    Alcohol use: Yes     Alcohol/week: 5.0 standard drinks of alcohol     Types: 2 Glasses of wine, 3 Cans of beer per week     Comment: occassionally    Drug use: Never           Review of Systems:  Other than ROS in the HPI, all other systems were negative.    Physical Exam:  Vital Signs:  Vitals:    01/08/24 1510   BP: 111/62   Pulse: 88   Resp: 20   Temp: 36.5 C (97.7 F)   SpO2: 99%   Weight: 85.3 kg (188 lb)       Vitals and nursing note reviewed.   Constitutional:       General: Patient is not in acute distress.     Appearance: Normal appearance.   HENT:      Mouth/Throat:      Mouth: Mucous membranes are moist.   Eyes:      Pupils: Pupils are equal, round, and reactive to light.   Neck:      Vascular: No carotid bruit.   Cardiovascular:      Rate and Rhythm: Normal rate and regular rhythm.      Pulses: Normal pulses.      Heart sounds: Normal heart sounds. No murmur heard.  Pulmonary:      Effort: Pulmonary effort is normal.      Breath sounds: Normal breath sounds.   Abdominal:      General: Bowel sounds are normal.      Palpations: Abdomen is soft.      Tenderness: There is no abdominal tenderness. There is no guarding.   Musculoskeletal:         General: No swelling or deformity.      Cervical back: Neck supple. No tenderness.      Right lower leg: No edema.  Left lower leg: No edema.   Lymphadenopathy:      Cervical: No cervical adenopathy.   Skin:     General: Skin is warm and dry.   Neurological:      General: No focal deficit present.      Mental Status: He is alert and oriented to person, place, and time.   Psychiatric:         Mood and Affect: Mood normal.          Thought Content: Thought content normal.   Physical Exam  VITALS: BP- 111/62         Previously completed labs, imaging studies and medical records were reviewed as part of this office visit.     Assessment/Plan:         ICD-10-CM    1. GAD (generalized anxiety disorder)  F41.1 escitalopram  oxalate (LEXAPRO ) 20 mg Oral Tablet     ALPRAZolam  (XANAX ) 0.5 mg Oral Tablet      2. 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 Hypertension, unspecified type  I10 lisinopriL  (PRINIVIL ) 10 mg Oral Tablet     COMPREHENSIVE METABOLIC PANEL, NON-FASTING     HGA1C (HEMOGLOBIN A1C WITH EST AVG GLUCOSE)      4. Mixed hyperlipidemia due to type 2 diabetes mellitus (CMS HCC)  (CMS HCC)  E11.69 simvastatin  (ZOCOR ) 20 mg Oral Tablet    E78.2 LIPID PANEL          Assessment & Plan  Type 2 diabetes mellitus, not on insulin  Blood sugar levels well-managed.  - Order A1c test.  - Monitor blood sugar levels.    Hypertension  Blood pressure well-controlled at 111/62 mmHg, likely aided by recent weight loss.  - Continue Lisinopril     Mixed hyperlipidemia  Weight loss may positively impact lipid profile.  - Order cholesterol test.  - Continue Simvastatin     Lumbar disc herniation/history of spinal stenosis  Intermittent back pain persists. Weight loss may alleviate symptoms. S-stem therapy beneficial, especially for knee pain.    Generalized anxiety disorder and depression  Anxiety managed with Alprazolam  and Escitalopram . Stress increases midday, leading to occasional need for additional Alprazolam  dose.  - Increase Alprazolam  prescription to 0.5 mg three times daily as needed, with a total of 75 tablets to allow flexibility for midday dosing.    Unintentional weight  loss (under evaluation)  Attributed to healthier eating and increased activity. Differential includes potential colon cancer if weight loss continues or other symptoms develop.  - Monitor weight closely.  - Consider colonoscopy if weight loss continues or if weight drops below 160-165 lbs.  - Advise to report any changes in stool or further weight loss.       Depression screening is negative. PHQ 2 Total: 0             Return in about 4 months (around 05/09/2024) for In Person Visit - CDM.    Daizha Anand, DO     You can see your note(s) in [facility]. It is common for you to encounter certain medical terminology which may be unfamiliar to you. You might see results before your provider does so please give  at least 2 business days for review. Please have this understanding, that NOT all abnormal results are significant. Our office will contact you for any urgent or emergent action if necessary. If you have any questions or concerns, feel free to send a MyChart message or call the office. Please call with any new or concerning symptoms.     This note was created with assistance from [employer] via capture of conversational audio. Consent was obtained from the patient and all parties present prior to recording.           [1]   Allergies  Allergen Reactions    Penicillins      Unknown - reaction as a child

## 2024-01-09 LAB — HGA1C (HEMOGLOBIN A1C WITH EST AVG GLUCOSE): HEMOGLOBIN A1C: 6.2 (ref 4.8–5.6)

## 2024-01-09 LAB — COMPREHENSIVE METABOLIC PANEL, NON-FASTING
ALBUMIN: 4.8 (ref 3.8–4.9)
ALKALINE PHOSPHATASE: 53 (ref 44–121)
ALT (SGPT): 27 (ref 0–44)
AST (SGOT): 23 (ref 0–40)
BILIRUBIN, TOTAL: 0.6 (ref 0.0–1.2)
BUN/CREAT RATIO: 15 (ref 10–24)
BUN: 15 (ref 8–27)
CALCIUM: 9.9 (ref 8.6–10.2)
CARBON DIOXIDE: 22 (ref 20–29)
CHLORIDE: 103 (ref 96–106)
CREATININE: 0.97 (ref 0.76–1.27)
ESTIMATED GLOMERULAR FILTRATION RATE: 86 (ref >59)
GLUCOSE,NONFAST: 140 (ref 70–99)
POTASSIUM: 5.4 (ref 3.5–5.2)
SODIUM: 140 (ref 134–144)
TOTAL PROTEIN: 7 (ref 6.0–8.5)

## 2024-01-09 LAB — LIPID PANEL
CHOLESTEROL: 109 (ref 100–199)
HDL-CHOLESTEROL: 40 (ref >39)
LDL (CALCULATED): 54 (ref 0–99)
TRIGLYCERIDES: 70 (ref 0–149)
VLDL (CALCULATED): 15 (ref 5–40)

## 2024-01-10 ENCOUNTER — Other Ambulatory Visit (RURAL_HEALTH_CENTER): Payer: Self-pay

## 2024-01-10 ENCOUNTER — Ambulatory Visit (RURAL_HEALTH_CENTER): Payer: Self-pay | Admitting: Family Medicine

## 2024-01-10 DIAGNOSIS — I10 Essential (primary) hypertension: Secondary | ICD-10-CM

## 2024-01-10 DIAGNOSIS — Z794 Long term (current) use of insulin: Secondary | ICD-10-CM

## 2024-01-10 DIAGNOSIS — E782 Mixed hyperlipidemia: Secondary | ICD-10-CM

## 2024-02-20 ENCOUNTER — Other Ambulatory Visit (RURAL_HEALTH_CENTER): Payer: Self-pay | Admitting: Family Medicine

## 2024-02-20 MED ORDER — TRAMADOL 50 MG TABLET: 1.0000 | ORAL_TABLET | Freq: Three times a day (TID) | ORAL | 2 refills | Status: DC

## 2024-03-19 ENCOUNTER — Other Ambulatory Visit (RURAL_HEALTH_CENTER): Payer: Self-pay | Admitting: Family Medicine

## 2024-03-19 DIAGNOSIS — F411 Generalized anxiety disorder: Secondary | ICD-10-CM

## 2024-04-22 ENCOUNTER — Other Ambulatory Visit (RURAL_HEALTH_CENTER): Payer: Self-pay | Admitting: Family Medicine

## 2024-04-22 DIAGNOSIS — F411 Generalized anxiety disorder: Secondary | ICD-10-CM

## 2024-04-23 MED ORDER — ALPRAZOLAM 0.5 MG TABLET
0.5000 mg | ORAL_TABLET | Freq: Three times a day (TID) | ORAL | 3 refills | Status: AC | PRN
Start: 2024-04-23 — End: ?

## 2024-05-08 ENCOUNTER — Other Ambulatory Visit: Payer: Self-pay

## 2024-05-09 ENCOUNTER — Ambulatory Visit (RURAL_HEALTH_CENTER): Payer: Self-pay | Attending: Family Medicine | Admitting: Family Medicine

## 2024-05-09 ENCOUNTER — Encounter (RURAL_HEALTH_CENTER): Payer: Self-pay | Admitting: Family Medicine

## 2024-05-09 VITALS — BP 162/94 | HR 99 | Temp 97.0°F | Resp 16 | Wt 188.0 lb

## 2024-05-09 DIAGNOSIS — E291 Testicular hypofunction: Secondary | ICD-10-CM | POA: Insufficient documentation

## 2024-05-09 DIAGNOSIS — E1169 Type 2 diabetes mellitus with other specified complication: Secondary | ICD-10-CM | POA: Insufficient documentation

## 2024-05-09 DIAGNOSIS — R5383 Other fatigue: Secondary | ICD-10-CM | POA: Insufficient documentation

## 2024-05-09 DIAGNOSIS — I10 Essential (primary) hypertension: Secondary | ICD-10-CM | POA: Insufficient documentation

## 2024-05-09 DIAGNOSIS — E119 Type 2 diabetes mellitus without complications: Secondary | ICD-10-CM | POA: Insufficient documentation

## 2024-05-09 DIAGNOSIS — F411 Generalized anxiety disorder: Secondary | ICD-10-CM | POA: Insufficient documentation

## 2024-05-09 DIAGNOSIS — Z794 Long term (current) use of insulin: Secondary | ICD-10-CM | POA: Insufficient documentation

## 2024-05-09 DIAGNOSIS — M5126 Other intervertebral disc displacement, lumbar region: Secondary | ICD-10-CM | POA: Insufficient documentation

## 2024-05-09 DIAGNOSIS — E782 Mixed hyperlipidemia: Secondary | ICD-10-CM | POA: Insufficient documentation

## 2024-05-09 DIAGNOSIS — M6250 Muscle wasting and atrophy, not elsewhere classified, unspecified site: Secondary | ICD-10-CM | POA: Insufficient documentation

## 2024-05-09 MED ORDER — TRAMADOL 50 MG TABLET: 1.0000 | ORAL_TABLET | Freq: Three times a day (TID) | ORAL | 2 refills | Status: AC

## 2024-05-09 NOTE — Progress Notes (Signed)
 FAMILY MEDICINE, [facility]  [address] [place]  [place] [address]  Operated by [facility]     Name: Shane Hines MRN:  [id number]   Date: 05/09/2024 Age: 68 y.o.          Provider: Harlene Spire, DO    Reason for visit: Follow Up 4 Months (Refill on tramadol )      History of Present Illness  Shane Hines is a 68 year old male who presents for a follow-up visit regarding his mental health and medication management.    Depressive symptoms and bereavement  - Wife perceives possible depression, though he does not endorse depressive symptoms himself.  - Recent bereavement following the passing of his stepmother, which has been emotionally challenging despite lack of closeness.  - Difficulty engaging in outdoor activities due to seasonal weather, contributing to low mood.    Anxiolytic use and sleep disturbance  - Uses Xanax  and finds reassurance in having extra doses available.  - Occasionally takes additional doses during the day; when this occurs, sometimes requires another dose at night to facilitate sleep.  - Occasional overuse leads to running short on medication.    Knee pain  - Experiencing recurrent knee pain.  - Finds symptomatic relief with ASTYM treatments from [facility].    Weight management concerns  - Monitoring weight at home, but observes a significant discrepancy between home scale and clinic scale.  - Eating habits have improved except when his wife, Shona, cooks.  - Expresses concern regarding the accuracy of his home scale.       PHQ Questionnaire  Little interest or pleasure in doing things.: Several Days  Feeling down, depressed, or hopeless: Several Days  PHQ 2 Total: 2  Trouble falling or staying asleep, or sleeping too much.: Not at all  Feeling tired or having little energy: Several Days  Poor appetite or overeating: Several Days  Feeling bad about yourself/ that you are a failure in the past 2 weeks?: Not at all  Trouble concentrating on  things in the past 2 weeks?: Not at all  Moving/Speaking slowly or being fidgety or restless  in the past 2 weeks?: Not at all  Thoughts that you would be better off DEAD, or of hurting yourself in some way.: Not at all  If you checked off any problems, how difficult have these problems made it for you to do your work, take care of things at home, or get along with other people?: Not difficult at all  PHQ 9 Total: 4  Interpretation of Total Score: 0-4 No depression              01/08/2024     1:50 PM 05/09/2024     3:07 PM   GAD-7 Questionnaire   Feeling nervous,anxious,on edge 3 2   Not being able to stop or control worrying 0 0   Worrying too much about different things 0 0   Trouble relaxing 0 0   Being so restless that it is hard to sit still 0 0   Becoming easily annoyed or irritable 0 0   Feeling afraid as if something awful might happen 0 0   How difficult have these problems made it for you to work, take care of things at home, or get along with other people? Not difficult at all Not difficult at all   Gad-7 Score Total 3 2   Interpretation 0-4, normal 0-4, normal  Historical Data    Past Medical History:  Past Medical History:   Diagnosis Date    Anxiety 05/09/2022    Diabetes mellitus, type 2 05/09/2022    Hypertension 05/09/2022    Spinal stenosis 05/09/2022    Spinal stenosis 05/09/2022         Allergies:  Allergies[1]  Medications:  Current Outpatient Medications   Medication Sig    ALPRAZolam  (XANAX ) 0.5 mg Oral Tablet Take 1 Tablet (0.5 mg total) by mouth Every 8 hours as needed for Insomnia    cyclobenzaprine  (FLEXERIL ) 10 mg Oral Tablet Take 1 Tablet (10 mg total) by mouth Three times a day    escitalopram  oxalate (LEXAPRO ) 20 mg Oral Tablet Take 1 Tablet (20 mg total) by mouth Daily    glimepiride  (AMARYL ) 2 mg Oral Tablet Take 1 Tablet (2 mg total) by mouth Twice a day before meals    lisinopriL  (PRINIVIL ) 10 mg Oral Tablet Take 1 Tablet (10 mg total) by mouth Daily    metFORMIN   (GLUCOPHAGE  XR) 500 mg Oral Tablet Sustained Release 24 hr Take 2 Tablets (1,000 mg total) by mouth Twice daily    simvastatin  (ZOCOR ) 20 mg Oral Tablet Take 1 Tablet (20 mg total) by mouth Every night    traMADoL  (ULTRAM ) 50 mg Oral Tablet Take 1 Tablet (50 mg total) by mouth Three times a day     Social History:  Social History     Socioeconomic History    Marital status: Married   Occupational History    Occupation: DISABLED   Tobacco Use    Smoking status: Former     Types: Cigarettes     Passive exposure: Past    Smokeless tobacco: Never   Vaping Use    Vaping status: Never Used   Substance and Sexual Activity    Alcohol use: Yes     Alcohol/week: 5.0 standard drinks of alcohol     Types: 2 Glasses of wine, 3 Cans of beer per week     Comment: occassionally    Drug use: Never           Review of Systems:  Other than ROS in the HPI, all other systems were negative.    Physical Exam:  Vital Signs:  Vitals:    05/09/24 1526   BP: (!) 162/94   Pulse: 99   Resp: 16   Temp: 36.1 C (97 F)   TempSrc: Skin   SpO2: 96%   Weight: 85.3 kg (188 lb)       Vitals and nursing note reviewed.     Physical Exam  MEASUREMENTS: Weight- 188.  CONSTITUTIONAL: Patient is not in acute distress, normal appearance.  HEAD EARS NOSE THROAT: Mucous membranes are moist.  EYES: Pupils are equal, round, and reactive to light.  NEUROLOGICAL: No focal deficit present, he is alert and oriented to person, place, and time.  NECK: No carotid bruit.  LYMPHADENOPATHY: No cervical adenopathy.  CARDIOVASCULAR: Normal rate and regular rhythm, normal pulses, normal heart sounds, no murmur heard.  PULMONARY: Pulmonary effort is normal, normal breath sounds.  ABDOMINAL: Bowel sounds are normal, abdomen is soft, there is no abdominal tenderness, there is no guarding.  MUSCULOSKELETAL: No swelling or deformity, neck supple, no tenderness, no edema in right lower leg, no edema in left lower leg.  SKIN: Skin is warm and dry.  PSYCHIATRIC: Mood normal, thought  content normal.       Previously completed labs, imaging studies and medical records were  reviewed as part of this office visit.     Assessment/Plan:         ICD-10-CM    1. Type 2 diabetes mellitus without complication, with long-term current use of insulin  E11.9 HGA1C (HEMOGLOBIN A1C WITH EST AVG GLUCOSE)    Z79.4 LIPID PANEL      2. 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 GAD (generalized anxiety disorder)  F41.1       4. Mixed hyperlipidemia due to type 2 diabetes mellitus (CMS HCC)  (CMS HCC)  E11.69 LIPID PANEL    E78.2       5. Hypertension, unspecified type  I10 CBC/DIFF     COMPREHENSIVE METABOLIC PANEL, NON-FASTING      6. Loss of muscle bulk  M62.50 TESTOSTERONE, TOTAL, MS      7. Fatigue, unspecified type  R53.83 TESTOSTERONE, TOTAL, MS      8. 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  - Ordered A1c test to assess diabetes control.    Essential hypertension  Blood pressure elevated today, likely situational. Previous readings were good.  - Continue current antihypertensive regimen.    Generalized anxiety disorder  Managed with alprazolam  and escitalopram . Extra alprazolam  helps during stress.  - Continue current anxiolytic regimen.    Male hypogonadism  Difficulty building muscle mass. Previous testosterone patches caused skin reactions. Discussed testosterone therapy risks.  - Ordered testosterone level test.  - Will discuss potential testosterone therapy options if levels are low.    Muscle wasting and atrophy  Difficulty building muscle mass.  - Ordered testosterone level test.    Fatigue  - Ordered testosterone level test.    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          The PHQ 2 Total: 2 depression screen is interpreted as negative.        Return in about  4 months (around 09/07/2024) for In Person Visit - CDM.    Porchia Sinkler, DO     You can see your note(s) in [facility]. It is common for you to encounter certain medical terminology which may be unfamiliar to you. You might see results before your provider does so please give at least 2 business days for review. Please have this understanding, that NOT all abnormal results are significant. Our office will contact you for any urgent or emergent action if necessary. If you have any questions or concerns, feel free to send a MyChart message or call the office. Please call with any new or concerning symptoms.     This note was created with assistance from [employer] via capture of conversational audio. Consent was obtained from the patient and all parties present prior to recording.           [1]   Allergies  Allergen Reactions    Penicillins      Unknown - reaction as a child

## 2024-05-17 LAB — CBC/DIFF
BASOPHILS: 1
BASOS ABS: 0 (ref 0.0–0.2)
EOS ABS: 0.4 (ref 0.0–0.4)
EOSINOPHIL: 7
HCT: 43.6 (ref 37.5–51.0)
HGB: 14.6 (ref 13.0–17.7)
LYMPHOCYTES: 37
LYMPHS ABS: 2.2 (ref 0.7–3.1)
MCH: 30.9 (ref 26.6–33.0)
MCHC: 33.5 (ref 31.5–35.7)
MCV: 92 (ref 79–97)
MONOCYTES: 9
MONOS ABS: 0.5 (ref 0.1–0.9)
PLATELET COUNT: 242 (ref 150–450)
RBC: 4.73 (ref 4.14–5.80)
RDW: 12 (ref 11.6–15.4)
WBC: 6 (ref 3.4–10.8)

## 2024-05-17 LAB — COMPREHENSIVE METABOLIC PANEL, NON-FASTING
ALBUMIN: 4.7 (ref 3.9–4.9)
ALKALINE PHOSPHATASE: 44 (ref 47–123)
ALT (SGPT): 24 (ref 0–44)
AST (SGOT): 20 (ref 0–40)
BILIRUBIN, TOTAL: 0.4 (ref 0.0–1.2)
BUN/CREAT RATIO: 14 (ref 10–24)
BUN: 12 (ref 8–27)
CALCIUM: 10 (ref 8.6–10.2)
CARBON DIOXIDE: 21 (ref 20–29)
CHLORIDE: 100 (ref 96–106)
CREATININE: 0.84 (ref 0.76–1.27)
ESTIMATED GLOMERULAR FILTRATION RATE: 96 (ref >59)
GLUCOSE,NONFAST: 130 (ref 70–99)
POTASSIUM: 5.5 (ref 3.5–5.2)
SODIUM: 136 (ref 134–144)
TOTAL PROTEIN: 6.9 (ref 6.0–8.5)

## 2024-05-17 LAB — LIPID PANEL
CHOLESTEROL: 143 (ref 100–199)
HDL-CHOLESTEROL: 44 (ref >39)
LDL (CALCULATED): 67 (ref 0–99)
TRIGLYCERIDES: 189 (ref 0–149)
VLDL (CALCULATED): 32 (ref 5–40)

## 2024-05-17 LAB — TESTOSTERONE, TOTAL, MS: TESTOSTERONE,TOTAL,LC/MS/MS: 492 (ref 264–916)

## 2024-05-17 LAB — HGA1C (HEMOGLOBIN A1C WITH EST AVG GLUCOSE): HEMOGLOBIN A1C: 6.1 (ref 4.8–5.6)

## 2024-05-20 ENCOUNTER — Other Ambulatory Visit (RURAL_HEALTH_CENTER): Payer: Self-pay

## 2024-05-20 DIAGNOSIS — I10 Essential (primary) hypertension: Secondary | ICD-10-CM

## 2024-05-20 DIAGNOSIS — E291 Testicular hypofunction: Secondary | ICD-10-CM

## 2024-05-20 DIAGNOSIS — M6250 Muscle wasting and atrophy, not elsewhere classified, unspecified site: Secondary | ICD-10-CM

## 2024-05-20 DIAGNOSIS — R5383 Other fatigue: Secondary | ICD-10-CM

## 2024-05-20 DIAGNOSIS — E119 Type 2 diabetes mellitus without complications: Secondary | ICD-10-CM

## 2024-05-20 DIAGNOSIS — E1169 Type 2 diabetes mellitus with other specified complication: Secondary | ICD-10-CM

## 2024-06-06 ENCOUNTER — Ambulatory Visit (RURAL_HEALTH_CENTER): Payer: Self-pay | Admitting: Family Medicine

## 2024-06-11 ENCOUNTER — Other Ambulatory Visit (RURAL_HEALTH_CENTER): Payer: Self-pay | Admitting: Family Medicine

## 2024-06-11 DIAGNOSIS — M5126 Other intervertebral disc displacement, lumbar region: Secondary | ICD-10-CM

## 2024-09-11 ENCOUNTER — Ambulatory Visit (RURAL_HEALTH_CENTER): Payer: Self-pay | Admitting: Family Medicine
